# Patient Record
Sex: Female | Born: 1970 | Race: White | Hispanic: No | Marital: Married | Smoking: Never smoker
Health system: Southern US, Community
[De-identification: ages and names within clinical notes are randomized; demographics above are authoritative.]

## PROBLEM LIST (undated history)

## (undated) DIAGNOSIS — Z8619 Personal history of other infectious and parasitic diseases: Secondary | ICD-10-CM

## (undated) DIAGNOSIS — R569 Unspecified convulsions: Secondary | ICD-10-CM

## (undated) DIAGNOSIS — Z87440 Personal history of urinary (tract) infections: Secondary | ICD-10-CM

## (undated) DIAGNOSIS — N6002 Solitary cyst of left breast: Secondary | ICD-10-CM

## (undated) DIAGNOSIS — N6001 Solitary cyst of right breast: Secondary | ICD-10-CM

## (undated) HISTORY — DX: Solitary cyst of right breast: N60.02

## (undated) HISTORY — DX: Personal history of other infectious and parasitic diseases: Z86.19

## (undated) HISTORY — PX: TUBAL LIGATION: SHX77

## (undated) HISTORY — DX: Solitary cyst of right breast: N60.01

## (undated) HISTORY — DX: Unspecified convulsions: R56.9

## (undated) HISTORY — DX: Personal history of urinary (tract) infections: Z87.440

---

## 1996-10-17 HISTORY — PX: OVARY SURGERY: SHX727

## 2001-07-31 ENCOUNTER — Inpatient Hospital Stay (HOSPITAL_COMMUNITY): Admission: AD | Admit: 2001-07-31 | Discharge: 2001-08-02 | Payer: Self-pay | Admitting: Physical Therapy

## 2001-08-29 ENCOUNTER — Other Ambulatory Visit: Admission: RE | Admit: 2001-08-29 | Discharge: 2001-08-29 | Payer: Self-pay | Admitting: Obstetrics and Gynecology

## 2003-01-29 ENCOUNTER — Other Ambulatory Visit: Admission: RE | Admit: 2003-01-29 | Discharge: 2003-01-29 | Payer: Self-pay | Admitting: Obstetrics and Gynecology

## 2004-03-10 ENCOUNTER — Other Ambulatory Visit: Admission: RE | Admit: 2004-03-10 | Discharge: 2004-03-10 | Payer: Self-pay | Admitting: Obstetrics and Gynecology

## 2004-07-16 ENCOUNTER — Emergency Department (HOSPITAL_COMMUNITY): Admission: EM | Admit: 2004-07-16 | Discharge: 2004-07-16 | Payer: Self-pay | Admitting: Emergency Medicine

## 2004-09-20 ENCOUNTER — Encounter: Admission: RE | Admit: 2004-09-20 | Discharge: 2004-09-20 | Payer: Self-pay | Admitting: Neurology

## 2005-07-15 ENCOUNTER — Other Ambulatory Visit: Admission: RE | Admit: 2005-07-15 | Discharge: 2005-07-15 | Payer: Self-pay | Admitting: Obstetrics and Gynecology

## 2008-02-29 ENCOUNTER — Ambulatory Visit: Payer: Self-pay | Admitting: Family Medicine

## 2008-04-03 ENCOUNTER — Ambulatory Visit: Payer: Self-pay | Admitting: Family Medicine

## 2009-03-03 ENCOUNTER — Ambulatory Visit: Payer: Self-pay | Admitting: Diagnostic Radiology

## 2009-03-03 ENCOUNTER — Emergency Department (HOSPITAL_BASED_OUTPATIENT_CLINIC_OR_DEPARTMENT_OTHER): Admission: EM | Admit: 2009-03-03 | Discharge: 2009-03-03 | Payer: Self-pay | Admitting: Emergency Medicine

## 2009-12-08 ENCOUNTER — Ambulatory Visit (HOSPITAL_COMMUNITY): Admission: RE | Admit: 2009-12-08 | Discharge: 2009-12-08 | Payer: Self-pay | Admitting: Obstetrics and Gynecology

## 2010-01-19 ENCOUNTER — Ambulatory Visit (HOSPITAL_COMMUNITY): Admission: RE | Admit: 2010-01-19 | Discharge: 2010-01-19 | Payer: Self-pay | Admitting: Obstetrics and Gynecology

## 2010-02-02 ENCOUNTER — Ambulatory Visit (HOSPITAL_COMMUNITY): Admission: RE | Admit: 2010-02-02 | Discharge: 2010-02-02 | Payer: Self-pay | Admitting: Obstetrics and Gynecology

## 2010-02-19 ENCOUNTER — Ambulatory Visit (HOSPITAL_COMMUNITY): Admission: RE | Admit: 2010-02-19 | Discharge: 2010-02-19 | Payer: Self-pay | Admitting: Obstetrics and Gynecology

## 2010-03-05 ENCOUNTER — Ambulatory Visit (HOSPITAL_COMMUNITY): Admission: RE | Admit: 2010-03-05 | Discharge: 2010-03-05 | Payer: Self-pay | Admitting: Obstetrics and Gynecology

## 2010-03-19 ENCOUNTER — Ambulatory Visit (HOSPITAL_COMMUNITY): Admission: RE | Admit: 2010-03-19 | Discharge: 2010-03-19 | Payer: Self-pay | Admitting: Obstetrics and Gynecology

## 2010-04-02 ENCOUNTER — Ambulatory Visit (HOSPITAL_COMMUNITY): Admission: RE | Admit: 2010-04-02 | Discharge: 2010-04-02 | Payer: Self-pay | Admitting: Obstetrics and Gynecology

## 2010-04-21 ENCOUNTER — Ambulatory Visit (HOSPITAL_COMMUNITY): Admission: RE | Admit: 2010-04-21 | Discharge: 2010-04-21 | Payer: Self-pay | Admitting: Obstetrics and Gynecology

## 2010-04-27 ENCOUNTER — Ambulatory Visit (HOSPITAL_COMMUNITY): Admission: RE | Admit: 2010-04-27 | Discharge: 2010-04-27 | Payer: Self-pay | Admitting: Obstetrics and Gynecology

## 2010-05-06 ENCOUNTER — Ambulatory Visit (HOSPITAL_COMMUNITY): Admission: RE | Admit: 2010-05-06 | Discharge: 2010-05-06 | Payer: Self-pay | Admitting: Obstetrics and Gynecology

## 2010-05-10 ENCOUNTER — Ambulatory Visit (HOSPITAL_COMMUNITY): Admission: RE | Admit: 2010-05-10 | Discharge: 2010-05-10 | Payer: Self-pay | Admitting: Obstetrics and Gynecology

## 2010-05-18 ENCOUNTER — Ambulatory Visit (HOSPITAL_COMMUNITY): Admission: RE | Admit: 2010-05-18 | Discharge: 2010-05-18 | Payer: Self-pay | Admitting: Obstetrics and Gynecology

## 2010-05-26 ENCOUNTER — Ambulatory Visit (HOSPITAL_COMMUNITY): Admission: RE | Admit: 2010-05-26 | Discharge: 2010-05-26 | Payer: Self-pay | Admitting: Obstetrics and Gynecology

## 2010-06-02 ENCOUNTER — Encounter (INDEPENDENT_AMBULATORY_CARE_PROVIDER_SITE_OTHER): Payer: Self-pay | Admitting: Obstetrics and Gynecology

## 2010-06-02 ENCOUNTER — Inpatient Hospital Stay (HOSPITAL_COMMUNITY): Admission: RE | Admit: 2010-06-02 | Discharge: 2010-06-04 | Payer: Self-pay | Admitting: Obstetrics and Gynecology

## 2010-12-30 LAB — CROSSMATCH
DAT, IgG: NEGATIVE
Donor AG Type: NEGATIVE
Donor AG Type: NEGATIVE
PT AG Type: NEGATIVE

## 2010-12-30 LAB — CBC
HCT: 27.2 % — ABNORMAL LOW (ref 36.0–46.0)
Hemoglobin: 9.5 g/dL — ABNORMAL LOW (ref 12.0–15.0)
MCH: 32.9 pg (ref 26.0–34.0)
MCHC: 34.8 g/dL (ref 30.0–36.0)
RBC: 2.89 MIL/uL — ABNORMAL LOW (ref 3.87–5.11)
RDW: 14.5 % (ref 11.5–15.5)

## 2010-12-31 LAB — CBC
MCH: 32.2 pg (ref 26.0–34.0)
RDW: 14.5 % (ref 11.5–15.5)

## 2010-12-31 LAB — SURGICAL PCR SCREEN: Staphylococcus aureus: NEGATIVE

## 2011-03-04 NOTE — Op Note (Signed)
[facility]  Patient:    Danielle Mays, Danielle Mays Visit Number: [id number] MRN: [id number]          Service Type: OBS Location: 910A [id number] [id number] Attending Physician:  Cordelia Pen Ii Dictated by:   Duke Salvia. Marcelle Overlie, M.D. Proc. Date: 07/31/01 Admit Date:  07/31/2001                             Operative Report  OBSTETRICIAN: Duke Salvia. Marcelle Overlie, M.D.  DELIVERY NOTE: The patient had been pushing for a little over 2-1/2 hours and achieved crowning, and preparations were made for vaginal delivery.  The fetal heart rate was stable through second stage per the nursing assessment.  The vertex delivered spontaneously.  There was a tight nuchal cord that was reduced.  Bulb suction was carried out at that point.  The torso delivered easily.  The infant failed to make initial good breathing effort.  The PT team was called immediately, and carried out stimulation plus Ambu assisted respiration with good heart rate until they arrived.  A pH was 7.20 arterial. Apgars 2, 4, 7.  Placenta delivered spontaneously intact.  Second degree perineal laceration was repaired in standard fashion.  No other lacerations noted.  Immediately prepartum she had a 102 degree temperature and ampicillin 2 g IV was started.  This will not be continued postpartum if she remains afebrile.  Mother and baby doing well at that point.  The baby went to the regular nursery.  Placenta was sent to pathology.  ESTIMATED BLOOD LOSS: EBL, 350 cc. Dictated by:   Duke Salvia. Marcelle Overlie, M.D. Attending Physician:  Soledad Gerlach DD:  07/31/01 TD:  08/01/01 Job: [id number]

## 2011-06-17 ENCOUNTER — Ambulatory Visit (INDEPENDENT_AMBULATORY_CARE_PROVIDER_SITE_OTHER): Payer: BC Managed Care – PPO | Admitting: Internal Medicine

## 2011-06-17 ENCOUNTER — Encounter: Payer: Self-pay | Admitting: Internal Medicine

## 2011-06-17 DIAGNOSIS — R05 Cough: Secondary | ICD-10-CM

## 2011-06-17 MED ORDER — LEVOFLOXACIN 500 MG PO TABS: 500.0000 mg | ORAL_TABLET | Freq: Every day | ORAL | Status: AC

## 2011-06-26 ENCOUNTER — Encounter: Payer: Self-pay | Admitting: Internal Medicine

## 2011-06-26 DIAGNOSIS — R059 Cough, unspecified: Secondary | ICD-10-CM | POA: Insufficient documentation

## 2011-06-26 DIAGNOSIS — R05 Cough: Secondary | ICD-10-CM | POA: Insufficient documentation

## 2011-06-26 NOTE — Progress Notes (Signed)
  Subjective:    Patient ID: Danielle Mays, female    DOB: 11/23/1970, 40 y.o.   MRN: [id number]  HPI Pt presents to clinic for evaluation of cough. Two weeks ago while on vacation developed cough and URI sx's. Seen at [facility] and dx'ed with pneumonia based on CXR reading by clinic physician. Took zpak without improvement. Seen at another UC with nl CXR. Given cough syrup with codeine. Now with cough productive for yellow sputum without hemoptysis, wheezing or dyspnea. Cough worse at night. No other alleviating or exacerbating factors. No other complaints.  Reviewed pmh, psh, medications, allergies, soc hx and fam hx.    Review of Systems  Constitutional: Negative for fever and chills.  Respiratory: Positive for cough. Negative for shortness of breath and wheezing.   All other systems reviewed and are negative.       Objective:   Physical Exam  Nursing note and vitals reviewed. Constitutional: She appears well-developed and well-nourished. No distress.  HENT:  Head: Normocephalic and atraumatic.  Right Ear: Tympanic membrane, external ear and ear canal normal.  Left Ear: Tympanic membrane, external ear and ear canal normal.  Nose: Nose normal.  Mouth/Throat: Oropharynx is clear and moist. No oropharyngeal exudate.  Eyes: Conjunctivae are normal. No scleral icterus.  Neck: Neck supple.  Cardiovascular: Normal rate, regular rhythm and normal heart sounds.  Exam reveals no gallop and no friction rub.   No murmur heard. Pulmonary/Chest: Effort normal and breath sounds normal. No respiratory distress. She has no wheezes. She has no rales.  Lymphadenopathy:    She has no cervical adenopathy.  Neurological: She is alert.  Skin: Skin is warm and dry. No rash noted. She is not diaphoretic.  Psychiatric: She has a normal mood and affect.          Assessment & Plan:

## 2011-06-26 NOTE — Assessment & Plan Note (Signed)
Hx suggestive of bronchitis. Recent cxr reportedly without infiltrate. Failed zpak. Begin levaquin 500mg  po qd x 10d. Followup if no improvement or worsening.

## 2011-07-06 ENCOUNTER — Telehealth: Payer: Self-pay | Admitting: Internal Medicine

## 2011-07-06 NOTE — Telephone Encounter (Signed)
Received medical records from [facility].

## 2011-07-14 ENCOUNTER — Encounter: Payer: BC Managed Care – PPO | Admitting: Internal Medicine

## 2011-07-19 ENCOUNTER — Encounter: Payer: BC Managed Care – PPO | Admitting: Internal Medicine

## 2011-07-21 ENCOUNTER — Other Ambulatory Visit: Payer: Self-pay | Admitting: Internal Medicine

## 2011-07-21 DIAGNOSIS — Z0000 Encounter for general adult medical examination without abnormal findings: Secondary | ICD-10-CM

## 2011-07-22 LAB — BASIC METABOLIC PANEL
CO2: 24 mEq/L (ref 19–32)
Chloride: 107 mEq/L (ref 96–112)
Creat: 0.79 mg/dL (ref 0.50–1.10)

## 2011-07-22 LAB — HEPATIC FUNCTION PANEL
ALT: 10 U/L (ref 0–35)
AST: 13 U/L (ref 0–37)
Albumin: 4.6 g/dL (ref 3.5–5.2)
Alkaline Phosphatase: 62 U/L (ref 39–117)
Bilirubin, Direct: 0.2 mg/dL (ref 0.0–0.3)
Indirect Bilirubin: 0.5 mg/dL (ref 0.0–0.9)
Total Bilirubin: 0.7 mg/dL (ref 0.3–1.2)
Total Protein: 6.8 g/dL (ref 6.0–8.3)

## 2011-07-22 LAB — URINALYSIS, ROUTINE W REFLEX MICROSCOPIC
Bilirubin Urine: NEGATIVE
Leukocytes, UA: NEGATIVE
Nitrite: NEGATIVE
Urobilinogen, UA: 0.2 mg/dL (ref 0.0–1.0)

## 2011-07-22 LAB — CBC
MCV: 89.2 fL (ref 78.0–100.0)
Platelets: 264 10*3/uL (ref 150–400)
RDW: 13.4 % (ref 11.5–15.5)

## 2011-07-22 LAB — HEMOGLOBIN A1C: Mean Plasma Glucose: 97 mg/dL (ref <117)

## 2011-07-22 LAB — LIPID PANEL: HDL: 42 mg/dL (ref >39)

## 2011-07-28 ENCOUNTER — Ambulatory Visit (INDEPENDENT_AMBULATORY_CARE_PROVIDER_SITE_OTHER): Payer: BC Managed Care – PPO | Admitting: Internal Medicine

## 2011-07-28 ENCOUNTER — Encounter: Payer: Self-pay | Admitting: Internal Medicine

## 2011-07-28 DIAGNOSIS — Z0000 Encounter for general adult medical examination without abnormal findings: Secondary | ICD-10-CM | POA: Insufficient documentation

## 2011-07-28 DIAGNOSIS — Z23 Encounter for immunization: Secondary | ICD-10-CM

## 2011-07-28 MED ORDER — LEVOFLOXACIN 500 MG PO TABS: 500.0000 mg | ORAL_TABLET | Freq: Every day | ORAL | Status: AC

## 2011-07-28 NOTE — Assessment & Plan Note (Signed)
Nl exam and labs reviewed. Given persistence of cough recommend additional course of levaquin x 7days. Followup if no improvement or worsening.

## 2011-07-28 NOTE — Patient Instructions (Signed)
Please schedule cbc, chem7, lipid, a1c, tsh, lft, urinalysis (v70.0) prior to next year's physical

## 2011-07-28 NOTE — Progress Notes (Signed)
  Subjective:    Patient ID: Danielle Mays, female    DOB: 1971/01/16, 40 y.o.   MRN: [id number]  HPI Pt presents to clinic for annual exam. Pap smears utd and reportedly nl followed by gyn. Reviewed nl cpe labs with patient. Needs tdap update. Now s/p zpak and levaquin course for likely bronchitis. Significantly improved. All sx's resolved with exception of mild cough productive for green sputum without hemoptysis. Had reportedly nl cxr at outside clinic. No other complaints.  Past Medical History  Diagnosis Date  . History of chicken pox     age 49  . History of recurrent UTIs     age 44-25 > 100   Past Surgical History  Procedure Date  . Ovary surgery 1998    Right ovary removed  . Cesarean section 07/03/2010    reports that she has never smoked. She has never used smokeless tobacco. She reports that she drinks alcohol. She reports that she does not use illicit drugs. family history includes Breast cancer in her maternal aunt; Diabetes type II in her mother; Drug abuse in her father; Emphysema in her paternal grandfather and paternal grandmother; Hypertension in her maternal grandfather, maternal grandmother, and mother; Ovarian cancer in her paternal grandmother; and Stroke in her maternal grandfather. No Known Allergies     Review of Systems see hpi     Objective:   Physical Exam  Physical Exam  Nursing note and vitals reviewed. Constitutional:  appears well-developed and well-nourished. No distress.  HENT:  Head: Normocephalic and atraumatic.  Right Ear: Tympanic membrane and external ear normal.  Left Ear: Tympanic membrane and external ear normal.  Nose: Nose normal.  Mouth/Throat: Uvula is midline, oropharynx is clear and moist and mucous membranes are normal. No oropharyngeal exudate.  Eyes: Conjunctivae and EOM are normal. Pupils are equal, round, and reactive to light. Right eye exhibits no discharge. Left eye exhibits no discharge. No scleral icterus.  Neck:  Neck supple. Carotid bruit is not present. No thyromegaly present.  Cardiovascular: Normal rate, regular rhythm and normal heart sounds.  Exam reveals no gallop and no friction rub.   No murmur heard. Pulmonary/Chest: Effort normal and breath sounds normal. No respiratory distress. He has no wheezes.  no rales.  Abdominal: Soft.  no distension and no mass. There is no hepatosplenomegaly. There is no tenderness. There is no rebound.Neurological: He is alert.  Skin: Skin is warm and dry.  not diaphoretic.  Psychiatric: has a normal mood and affect.         Assessment & Plan:

## 2011-12-08 IMAGING — US US OB MCA DOPPLER
1 series · 14 of 19 positions shown · non-contrast
Comparison: none

OBSTETRICAL ULTRASOUND:
 This ultrasound was performed in The [HOSPITAL], and the [facility] report will be stored to [REDACTED] PACS.  This report is also available in [HOSPITAL]?s accessANYware.

[Series 1: us ob mca doppler · 14 of 19 slices shown]
[im 1/19]
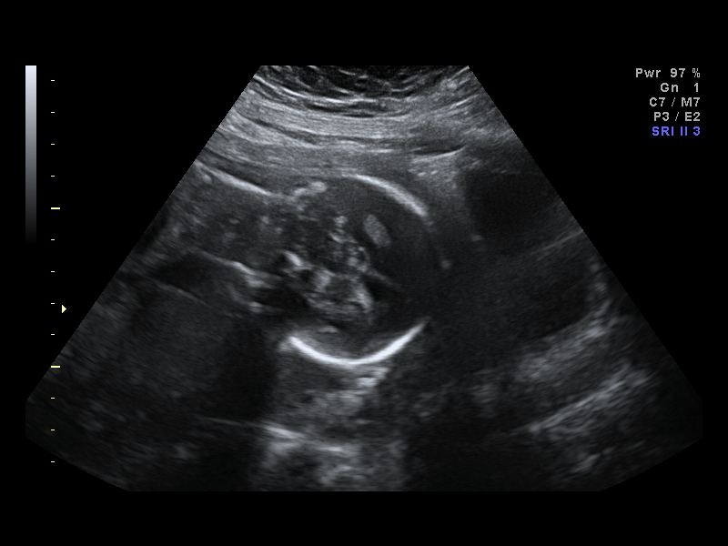
[im 3/19]
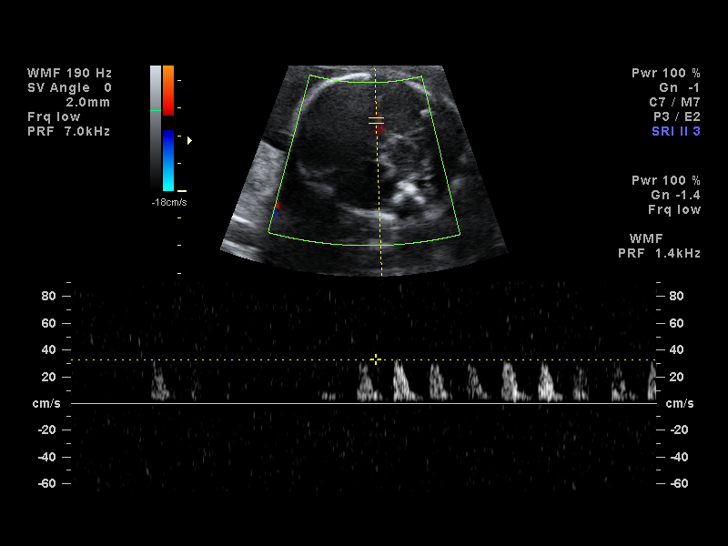
[im 4/19]
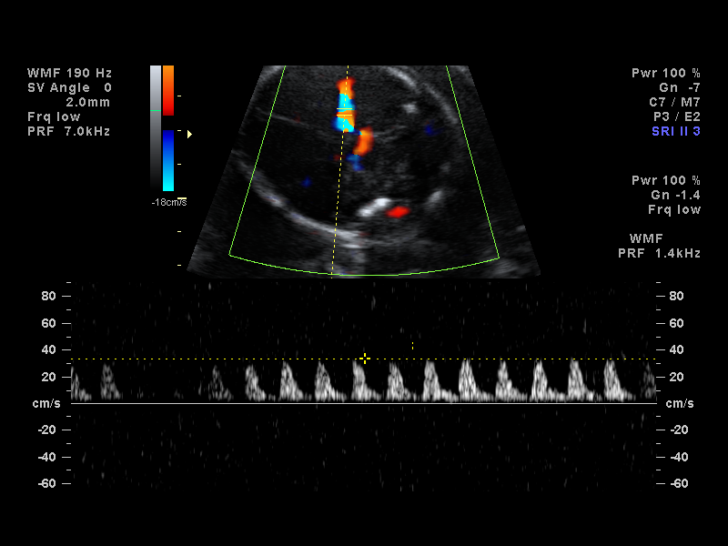
[im 5/19]
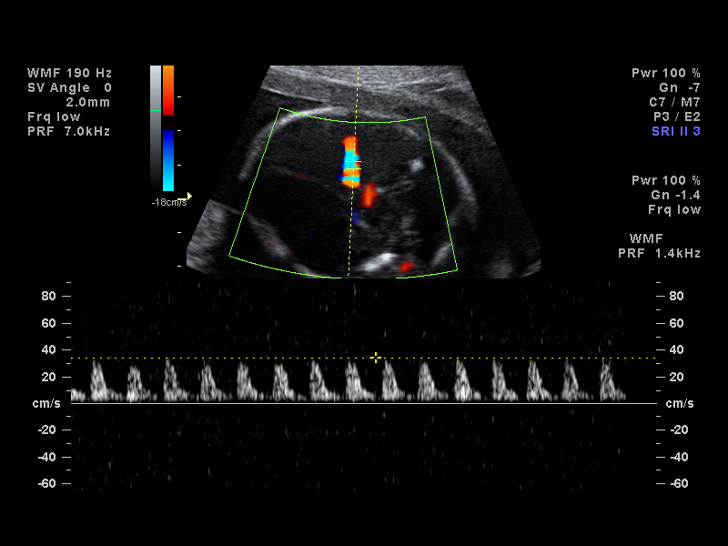
[im 7/19]
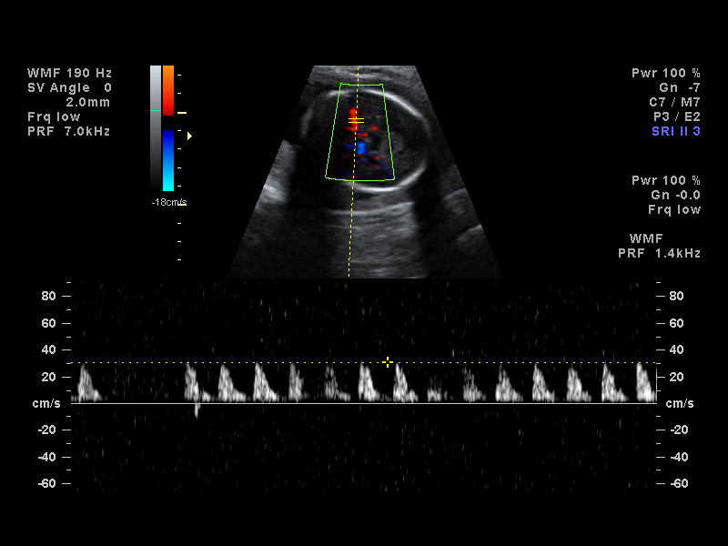
[im 8/19]
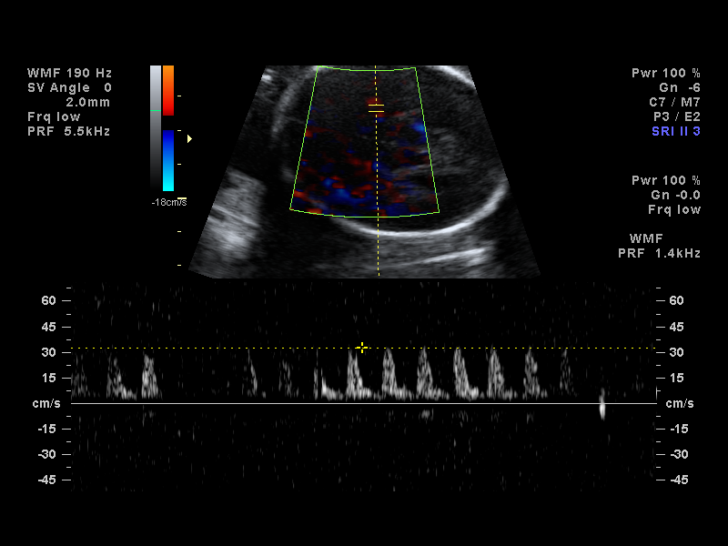
[im 9/19]
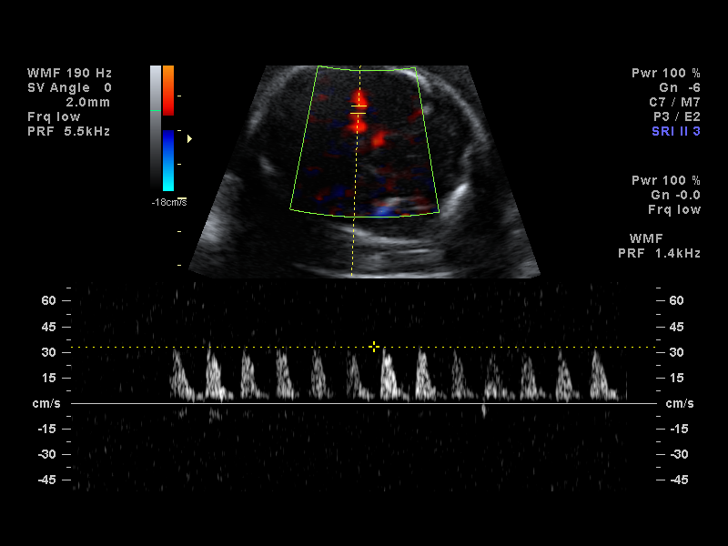
[im 11/19]
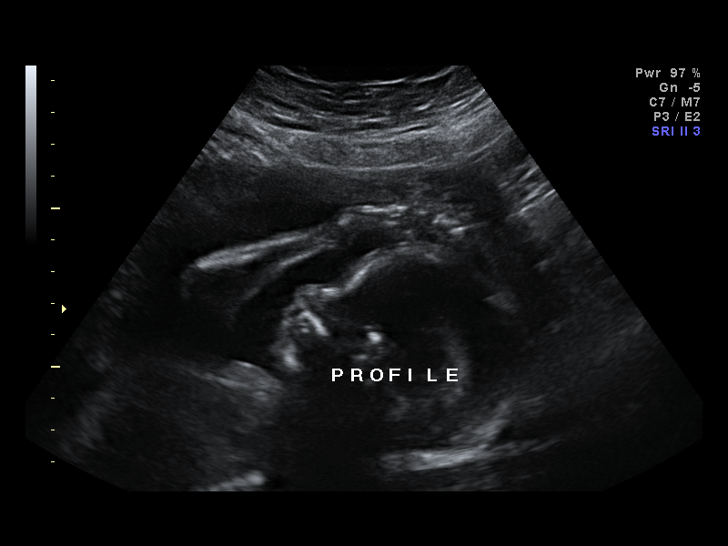
[im 12/19]
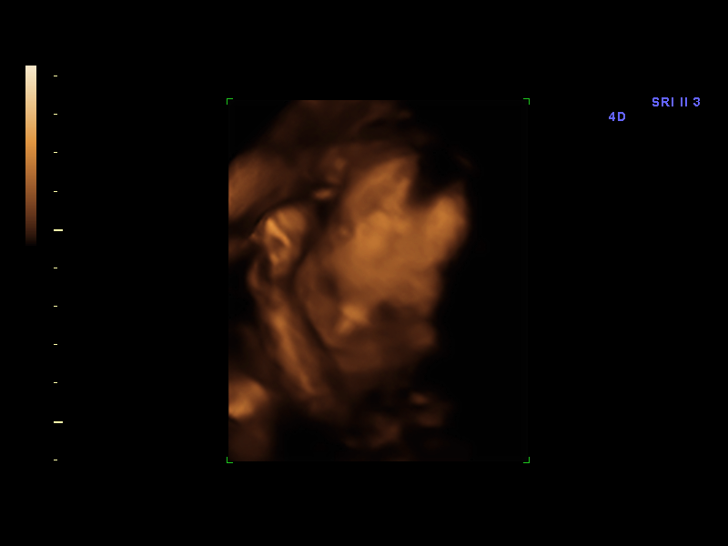
[im 13/19]
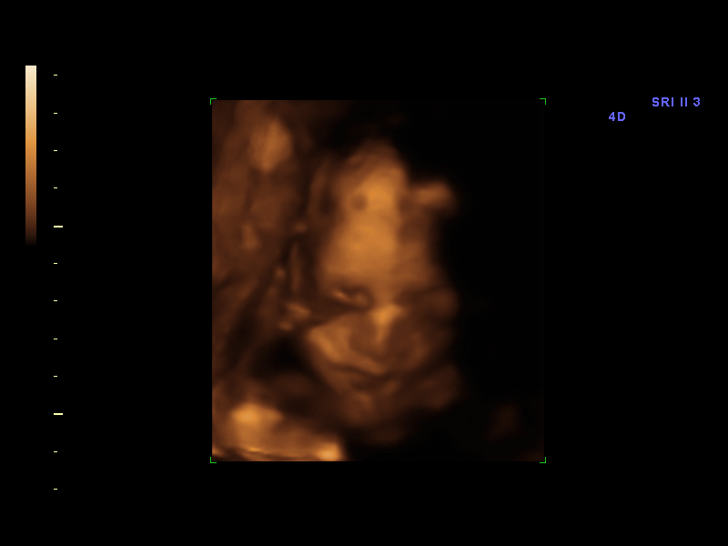
[im 15/19]
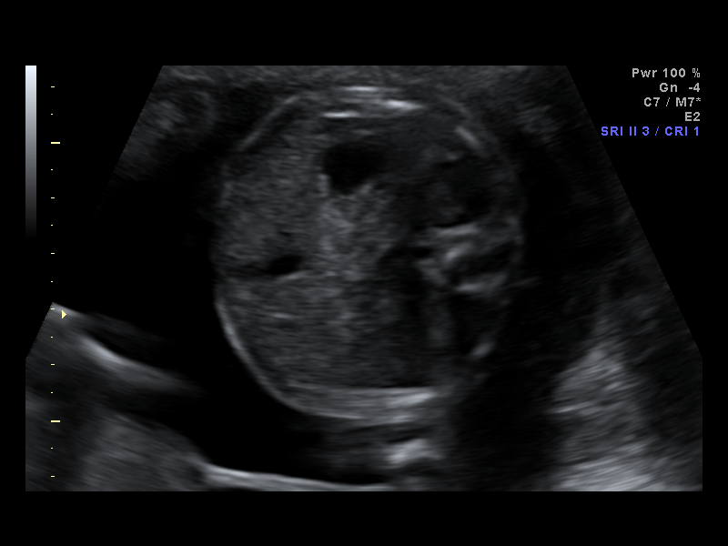
[im 16/19]
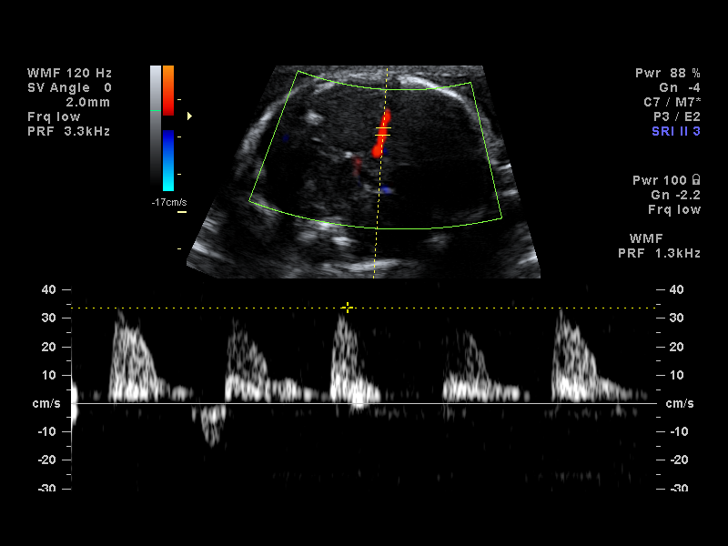
[im 17/19]
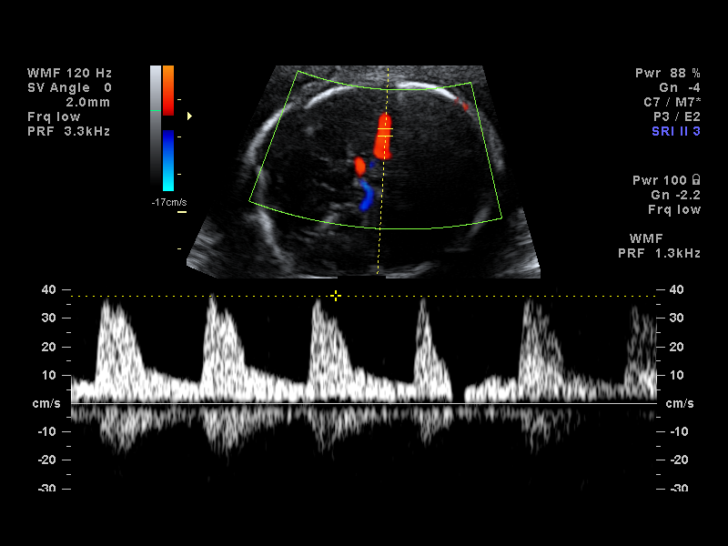
[im 19/19]
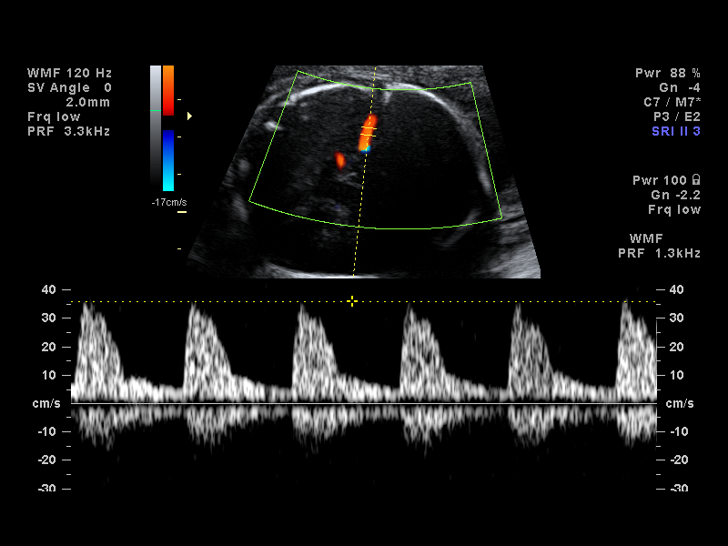

[14 of 19 positions shown; findings below may reference images not displayed]

IMPRESSION: [facility] has also been faxed to the ordering physician.

## 2013-10-22 ENCOUNTER — Ambulatory Visit (INDEPENDENT_AMBULATORY_CARE_PROVIDER_SITE_OTHER): Payer: BC Managed Care – PPO | Admitting: Family

## 2013-10-22 ENCOUNTER — Encounter: Payer: Self-pay | Admitting: Family

## 2013-10-22 VITALS — BP 118/82 | HR 89 | Temp 98.0°F | Resp 16 | Ht 62.0 in | Wt 206.0 lb

## 2013-10-22 DIAGNOSIS — G44209 Tension-type headache, unspecified, not intractable: Secondary | ICD-10-CM | POA: Insufficient documentation

## 2013-10-22 DIAGNOSIS — R635 Abnormal weight gain: Secondary | ICD-10-CM

## 2013-10-22 DIAGNOSIS — R5383 Other fatigue: Secondary | ICD-10-CM

## 2013-10-22 DIAGNOSIS — E039 Hypothyroidism, unspecified: Secondary | ICD-10-CM

## 2013-10-22 DIAGNOSIS — M79601 Pain in right arm: Secondary | ICD-10-CM

## 2013-10-22 DIAGNOSIS — M79609 Pain in unspecified limb: Secondary | ICD-10-CM

## 2013-10-22 DIAGNOSIS — R079 Chest pain, unspecified: Secondary | ICD-10-CM

## 2013-10-22 DIAGNOSIS — R0789 Other chest pain: Secondary | ICD-10-CM

## 2013-10-22 DIAGNOSIS — M79603 Pain in arm, unspecified: Secondary | ICD-10-CM | POA: Insufficient documentation

## 2013-10-22 DIAGNOSIS — R5381 Other malaise: Secondary | ICD-10-CM

## 2013-10-22 LAB — CBC WITH DIFFERENTIAL/PLATELET
Basophils Absolute: 0 10*3/uL (ref 0.0–0.1)
Basophils Relative: 1 % (ref 0–1)
EOS ABS: 0.2 10*3/uL (ref 0.0–0.7)
EOS PCT: 3 % (ref 0–5)
HCT: 38.9 % (ref 36.0–46.0)
HEMOGLOBIN: 13.3 g/dL (ref 12.0–15.0)
LYMPHS ABS: 3.2 10*3/uL (ref 0.7–4.0)
Lymphocytes Relative: 41 % (ref 12–46)
MCH: 29.5 pg (ref 26.0–34.0)
MCHC: 34.2 g/dL (ref 30.0–36.0)
MCV: 86.3 fL (ref 78.0–100.0)
MONOS PCT: 6 % (ref 3–12)
Monocytes Absolute: 0.5 10*3/uL (ref 0.1–1.0)
NEUTROS ABS: 3.9 10*3/uL (ref 1.7–7.7)
Neutrophils Relative %: 49 % (ref 43–77)
Platelets: 271 10*3/uL (ref 150–400)
RBC: 4.51 MIL/uL (ref 3.87–5.11)
RDW: 13.2 % (ref 11.5–15.5)
WBC: 7.8 10*3/uL (ref 4.0–10.5)

## 2013-10-22 NOTE — Assessment & Plan Note (Signed)
Symptoms most consistent with mild tension HA, likely exacerbated by neck tightness/pain. She works at a [employer] and I have advised her to work on Psychiatristproper body mechanics. She wishes to avoid additional medications for HA at this time.

## 2013-10-22 NOTE — Assessment & Plan Note (Signed)
Consistent with musculoskeletal pain. Pt has a 933 year old child who she carries sometimes and I think this is potential exacerbating factor.  Offered, NSAID, she declines.

## 2013-10-22 NOTE — Assessment & Plan Note (Signed)
Given heat intolerance, weight gain, concern for thyroid dysfunction. Will check TSH, and alsoobtain CBC to evaluate for anemia given fatigue.

## 2013-10-22 NOTE — Progress Notes (Signed)
Subjective:    Patient ID: Danielle Mays, female    DOB: 01/22/1971, 43 y.o.   MRN: [id number]  HPI  Danielle Mays is a 43 yr old female who presents today with several concerns. She was last seen at our office 07/28/11 for annual visit.    1) Headache- intermittent HA since July, constant since November. Reports feeling fatigued x 6 months, sweats at night- + dizziness which was severe on Sunday and lasted into Monday.  Weight has been rising.  Had initially lost some weight on Adipex.  Has an IUD- does not get periods.  She reports today she has pressure behind the left eye.  HA is dull and mild.  Usually has pressure on left side of head and has some strained pain at the base of the neck.  She reports that she has had some associated nausea this week. Denies photophobia.  Reports that the right eye seems to be slightly blurry but just had a normal eye exam and got new contacts.   2) Chest pain/R arm pain-  Reports intermittent episodes of chest pain in July and august. Reports that [place] Reports that symptoms seemed like heartburn. Took tums, woke up still had chest discomfort. This lasted intermittently for several weeks.  Had associated right arm pain with chest pain.  Chest pain is resolved by R arm pain persists. Arm pain is described as a dull soreness.    Review of Systems See HPI  Past Medical History  Diagnosis Date  . History of chicken pox     age 43  . History of recurrent UTIs     age 43-25 > 100    History   Social History  . Marital Status: Married    Spouse Name: N/A    Number of Children: N/A  . Years of Education: N/A   Occupational History  . Not on file.   Social History Main Topics  . Smoking status: Never Smoker   . Smokeless tobacco: Never Used  . Alcohol Use: Yes  . Drug Use: No  . Sexual Activity: Not on file   Other Topics Concern  . Not on file   Social History Narrative  . No narrative on  file    Past Surgical History  Procedure Laterality Date  . Ovary surgery  1998    Right ovary removed  . Cesarean section  07/03/2010    Family History  Problem Relation Age of Onset  . Drug abuse Father     died cocaine overdose  . Ovarian cancer Paternal Grandmother   . Breast cancer Maternal Aunt   . Emphysema Paternal Grandfather   . Emphysema Paternal Grandmother   . Stroke Maternal Grandfather   . Hypertension Mother   . Hypertension Maternal Grandfather   . Hypertension Maternal Grandmother   . Diabetes type II Mother     No Known Allergies  Current Outpatient Prescriptions on File Prior to Visit  Medication Sig Dispense Refill  . levonorgestrel (MIRENA) 20 MCG/24HR IUD 1 each by Intrauterine route once.        . Multiple Vitamin (MULTIVITAMIN) tablet Take 1 tablet by mouth daily.         No current facility-administered medications on file prior to visit.    BP 118/82  Pulse 89  Temp(Src) 98 F (36.7 C) (Oral)  Resp 16  Ht 5\' 2"  (1.575 m)  Wt 206 lb (93.441 kg)  BMI 37.67 kg/m2  SpO2 98%       Objective:   Physical Exam  Constitutional: She is oriented to person, place, and time. She appears well-developed and well-nourished. No distress.  HENT:  Head: Normocephalic and atraumatic.  Neck: Neck supple. No spinous process tenderness present. No edema and no erythema present. No thyromegaly present.  Cardiovascular: Normal rate and regular rhythm.   No murmur heard. Pulmonary/Chest: Effort normal and breath sounds normal. No respiratory distress. She has no wheezes. She has no rales. She exhibits no tenderness.  Musculoskeletal:       Arms: Mild right arm tenderness to palpation  Lymphadenopathy:    She has no cervical adenopathy.  Neurological: She is alert and oriented to person, place, and time.  Psychiatric: She has a normal mood and affect. Her behavior is normal. Judgment and thought content normal.          Assessment & Plan:

## 2013-10-22 NOTE — Assessment & Plan Note (Signed)
Resolved, was likely GI in origin.  EKG reviewed today- normal.

## 2013-10-22 NOTE — Progress Notes (Signed)
Pre visit review using our clinic review tool, if applicable. No additional management support is needed unless otherwise documented below in the visit note. 

## 2013-10-22 NOTE — Patient Instructions (Signed)
Please complete your lab work prior to leaving. Follow up in 1 month, sooner if problems/concerns.

## 2013-10-23 ENCOUNTER — Encounter: Payer: Self-pay | Admitting: Family

## 2013-10-23 LAB — BASIC METABOLIC PANEL WITH GFR
BUN: 10 mg/dL (ref 6–23)
CALCIUM: 9.3 mg/dL (ref 8.4–10.5)
CHLORIDE: 105 meq/L (ref 96–112)
CO2: 26 meq/L (ref 19–32)
Creat: 0.61 mg/dL (ref 0.50–1.10)
GFR, Est Non African American: >89 mL/min
GLUCOSE: 81 mg/dL (ref 70–99)
Potassium: 4.7 mEq/L (ref 3.5–5.3)
Sodium: 139 mEq/L (ref 135–145)

## 2013-10-23 LAB — TSH: TSH: 2.264 u[IU]/mL (ref 0.350–4.500)

## 2013-10-28 ENCOUNTER — Telehealth: Payer: Self-pay | Admitting: *Deleted

## 2013-10-28 NOTE — Telephone Encounter (Signed)
Pt left message requesting recent lab results.  Notified pt of normal results per lab letter.

## 2013-11-15 ENCOUNTER — Other Ambulatory Visit (INDEPENDENT_AMBULATORY_CARE_PROVIDER_SITE_OTHER): Payer: Self-pay | Admitting: Otolaryngology

## 2013-11-15 DIAGNOSIS — R29898 Other symptoms and signs involving the musculoskeletal system: Secondary | ICD-10-CM

## 2013-11-15 DIAGNOSIS — R200 Anesthesia of skin: Secondary | ICD-10-CM

## 2013-11-25 ENCOUNTER — Ambulatory Visit
Admission: RE | Admit: 2013-11-25 | Discharge: 2013-11-25 | Disposition: A | Discharge status: Home / Self Care | Payer: BC Managed Care – PPO | Source: Ambulatory Visit | Attending: Otolaryngology | Admitting: Otolaryngology

## 2013-11-25 DIAGNOSIS — R200 Anesthesia of skin: Secondary | ICD-10-CM

## 2013-11-25 DIAGNOSIS — R29898 Other symptoms and signs involving the musculoskeletal system: Secondary | ICD-10-CM

## 2013-11-25 MED ORDER — GADOBENATE DIMEGLUMINE 529 MG/ML IV SOLN
19.0000 mL | Freq: Once | INTRAVENOUS | Status: AC | PRN
  Administered 2013-11-25: 19 mL via INTRAVENOUS

## 2013-11-28 ENCOUNTER — Ambulatory Visit: Payer: BC Managed Care – PPO | Admitting: Family Medicine

## 2013-11-28 ENCOUNTER — Encounter: Payer: Self-pay | Admitting: Neurology

## 2013-11-28 ENCOUNTER — Ambulatory Visit (INDEPENDENT_AMBULATORY_CARE_PROVIDER_SITE_OTHER): Payer: BC Managed Care – PPO | Admitting: Neurology

## 2013-11-28 ENCOUNTER — Encounter (INDEPENDENT_AMBULATORY_CARE_PROVIDER_SITE_OTHER): Payer: Self-pay

## 2013-11-28 ENCOUNTER — Ambulatory Visit (INDEPENDENT_AMBULATORY_CARE_PROVIDER_SITE_OTHER): Payer: BC Managed Care – PPO

## 2013-11-28 VITALS — BP 132/73 | HR 92 | Ht 62.0 in | Wt 207.0 lb

## 2013-11-28 DIAGNOSIS — R202 Paresthesia of skin: Secondary | ICD-10-CM | POA: Insufficient documentation

## 2013-11-28 DIAGNOSIS — H5310 Unspecified subjective visual disturbances: Secondary | ICD-10-CM

## 2013-11-28 DIAGNOSIS — R209 Unspecified disturbances of skin sensation: Secondary | ICD-10-CM

## 2013-11-28 NOTE — Progress Notes (Signed)
PATIENT: Danielle Mays DOB: 07/02/1971  HISTORICAL  Danielle Mays is a 43 yo RH Caucasian female, referred by ENT Dr. Suszanne Connerseoh for constellation of complaints   In July 2014, she woke up notice right arm tingling, intermittent at the beginning, later she noticed more tingling while holding pen, writing, by August 2014, she noticed both arm, hands numbness tingling.  In September October 2014, she also experienced extreme fatigue, lack of energy, in November around Thanksgiving time, she began to notice difficulty remembering, focusing, she become hearing during today's interview  Now she has constant bilateral hands, right leg numbness tingling, recent few weeks, she also noticed the left eye strained sensation when moving around,   Mild color washout, mild blurry vision.  She denies significant gait difficulty, no bowel bladder incontinence,  She has looked up internet, worry about the possibility of  multiple sclerosis   She [facility], with without contrast, we have looked at the films together, only few subcortical   white matter disease,  most consistent with small vessel disease  Recent laboratory showed normal CBC, TSH, BMP  REVIEW OF SYSTEMS: Full 14 system review of systems performed and notable only for weight gain, fatigue, blurry vision, eye pain, memory loss, headaches, numbness, weakness, dizziness   ALLERGIES: No Known Allergies  HOME MEDICATIONS: Current Outpatient Prescriptions on File Prior to Visit  Medication Sig Dispense Refill  . levonorgestrel (MIRENA) 20 MCG/24HR IUD 1 each by Intrauterine route once.        . Multiple Vitamin (MULTIVITAMIN) tablet Take 1 tablet by mouth daily.           PAST MEDICAL HISTORY: Past Medical History  Diagnosis Date  . History of chicken pox at age 43.   Marland Kitchen. History of recurrent UTIs     PAST SURGICAL HISTORY: Past Surgical History  Procedure Laterality Date  . Ovary  surgery  1998    Right ovary removed  . Cesarean section  07/03/2010    FAMILY HISTORY: Family History  Problem Relation Age of Onset  . Drug abuse Father     died cocaine overdose  . Ovarian cancer Paternal Grandmother   . Emphysema Paternal Grandmother   . Breast cancer Maternal Aunt   . Emphysema Paternal Grandfather   . Stroke Maternal Grandfather   . Hypertension Maternal Grandfather   . Hypertension Mother   . Diabetes type II Mother   . Hypertension Maternal Grandmother     SOCIAL HISTORY:  History   Social History  . Marital Status: Married    Spouse Name: N/A    Number of Children: 2  . Years of Education: N/A   Occupational History  . Software engineerHuman resource Director   Social History Main Topics  . Smoking status: Never Smoker   . Smokeless tobacco: Never Used  . Alcohol Use: Yes  . Drug Use: No  . Sexual Activity: Not on file   Other Topics Concern  . Not on file   Social History Narrative  . No narrative on file    PHYSICAL EXAM   Filed Vitals:   11/28/13 1402  BP: 132/73  Pulse: 92  Height: 5\' 2"  (1.575 m)  Weight: 207 lb (93.895 kg)     Body mass index is 37.85 kg/(m^2).   Generalized: In no acute distress  Neck: Supple, no carotid bruits   Cardiac: Regular rate rhythm  Pulmonary: Clear to auscultation bilaterally  Musculoskeletal: No deformity  Neurological examination  Mentation: Alert oriented to time, place, history taking, and causual conversation  Cranial nerve II-XII: Pupils were equal round reactive to light. Extraocular movements were full.  Visual field were full on confrontational test. Bilateral fundi were sharp.  Facial sensation and strength were normal. Hearing was intact to finger rubbing bilaterally. Uvula tongue midline.  Head turning and shoulder shrug and were normal and symmetric.Tongue protrusion into cheek strength was normal.  Motor: Normal tone, bulk and strength.  Sensory: Intact to fine touch, pinprick,  preserved vibratory sensation, and proprioception at toes.  Coordination: Normal finger to nose, heel-to-shin bilaterally there was no truncal ataxia  Gait: Rising up from seated position without assistance, normal stance, without trunk ataxia, moderate stride, good arm swing, smooth turning, able to perform tiptoe, and heel walking without difficulty.   Romberg signs: Negative  Deep tendon reflexes: Brachioradialis 2/2, biceps 2/2, triceps 2/2, patellar 2/2, Achilles 2/2, plantar responses were flexor bilaterally.   DIAGNOSTIC DATA (LABS, IMAGING, TESTING) - I reviewed patient records, labs, notes, testing and imaging myself where available.  Lab Results  Component Value Date   WBC 7.8 10/22/2013   HGB 13.3 10/22/2013   HCT 38.9 10/22/2013   MCV 86.3 10/22/2013   PLT 271 10/22/2013      Component Value Date/Time   NA 139 10/22/2013 1210   K 4.7 10/22/2013 1210   CL 105 10/22/2013 1210   CO2 26 10/22/2013 1210   GLUCOSE 81 10/22/2013 1210   BUN 10 10/22/2013 1210   CREATININE 0.61 10/22/2013 1210   CALCIUM 9.3 10/22/2013 1210   PROT 6.8 07/21/2011 1438   ALBUMIN 4.6 07/21/2011 1438   AST 13 07/21/2011 1438   ALT 10 07/21/2011 1438   ALKPHOS 62 07/21/2011 1438   BILITOT 0.7 07/21/2011 1438   Lab Results  Component Value Date   CHOL 146 07/21/2011   HDL 42 07/21/2011   LDLCALC 96 07/21/2011   TRIG 39 07/21/2011   CHOLHDL 3.5 07/21/2011   Lab Results  Component Value Date   HGBA1C 5.0 07/21/2011   No results found for this basename: [id number]   Lab Results  Component Value Date   TSH 2.264 10/22/2013    ASSESSMENT AND PLAN  Danielle Mays is a 43 y.o. female complains of  constellation of complaints detailed above, essentially normal neurological examination, normal MRI of the brain  1. not sure the exact etiology of her complaints, But differentiation diagnosis including left optic neuritis, cervical cord lesions, bilateral carpal tunnel syndromes   2 proceed with visual evoked potential,  MRI of cervical spine without contrast 3 laboratory evaluations 4.  EMG nerve conduction study     Levert Feinstein, M.D. Ph.D.  [facility] [address] [place], [place] [address] [phone]

## 2013-11-28 NOTE — Procedures (Signed)
    History:   Danielle Mays is a 43 year old patient with a history of tingling in the hands, blurring of vision with the left eye, pain in the extremities. MRI the brain has been unremarkable, but the patient is being evaluated for possible optic neuritis.  Description: The visual evoked response test was performed today using 32 x 32 check sizes. The absolute latencies for the N1 and the P100 wave forms were within normal limits bilaterally. The amplitudes for the P100 wave forms were also within normal limits bilaterally. The visual acuity was 20/30 OD and 20/30 OS corrected.  Impression:  The visual evoked response test above was within normal limits bilaterally. No evidence of conduction slowing was seen within the anterior visual pathways on either side on today's evaluation.

## 2013-12-02 NOTE — Progress Notes (Signed)
Quick Note:  Called and shared normal labs per Dr Zannie CoveYan's findings,patient vervalized understanding. ______

## 2013-12-05 LAB — IFE AND PE, SERUM
ALBUMIN SERPL ELPH-MCNC: 4.2 g/dL (ref 3.2–5.6)
ALBUMIN/GLOB SERPL: 1.5 (ref 0.7–2.0)
ALPHA 1: 0.2 g/dL (ref 0.1–0.4)
Alpha2 Glob SerPl Elph-Mcnc: 0.6 g/dL (ref 0.4–1.2)
B-GLOBULIN SERPL ELPH-MCNC: 1 g/dL (ref 0.6–1.3)
GAMMA GLOB SERPL ELPH-MCNC: 1.1 g/dL (ref 0.5–1.6)
Globulin, Total: 2.9 g/dL (ref 2.0–4.5)
IGG (IMMUNOGLOBIN G), SERUM: 1284 mg/dL (ref 700–1600)
IgA/Immunoglobulin A, Serum: 57 mg/dL — ABNORMAL LOW (ref 91–414)
IgM (Immunoglobulin M), Srm: 119 mg/dL (ref 40–230)
Total Protein: 7.1 g/dL (ref 6.0–8.5)

## 2013-12-05 LAB — SEDIMENTATION RATE: Sed Rate: 8 mm/hr (ref 0–32)

## 2013-12-05 LAB — LYME, TOTAL AB TEST/REFLEX

## 2013-12-05 LAB — CK: CK TOTAL: 76 U/L (ref 24–173)

## 2013-12-05 LAB — NMO IGG AUTOANTIBODIES: NMO-IgG: <1.6 U/mL (ref 0.0–3.0)

## 2013-12-05 LAB — VITAMIN D 1,25 DIHYDROXY: Vit D, 1,25-Dihydroxy: 107.5 pg/mL — ABNORMAL HIGH (ref 10.0–75.0)

## 2013-12-05 LAB — RPR: RPR: NONREACTIVE

## 2013-12-05 LAB — VITAMIN B12: VITAMIN B 12: 1047 pg/mL — AB (ref 211–946)

## 2013-12-05 LAB — ANA W/REFLEX IF POSITIVE: ANA: NEGATIVE

## 2013-12-10 ENCOUNTER — Encounter: Payer: BC Managed Care – PPO | Admitting: Neurology

## 2013-12-14 ENCOUNTER — Ambulatory Visit
Admission: RE | Admit: 2013-12-14 | Discharge: 2013-12-14 | Disposition: A | Discharge status: Home / Self Care | Payer: BC Managed Care – PPO | Source: Ambulatory Visit | Attending: Neurology | Admitting: Neurology

## 2013-12-14 DIAGNOSIS — R209 Unspecified disturbances of skin sensation: Secondary | ICD-10-CM

## 2013-12-14 DIAGNOSIS — R202 Paresthesia of skin: Secondary | ICD-10-CM

## 2013-12-17 ENCOUNTER — Telehealth: Payer: Self-pay | Admitting: Neurology

## 2013-12-17 NOTE — Telephone Encounter (Signed)
Lupita LeashDonna:  Please call patient, MRI of cervical spine showed degenerative disc disease, most  severe at C5 and 6, but there was no significant canal stenosis, will not explain her symptoms.  MRI of the brain was normal

## 2013-12-19 NOTE — Telephone Encounter (Signed)
Spoke to patient and relayed MRI brain and MRI cervical results, per Dr. Terrace ArabiaYan.  Patient expressed understanding, and said she is actually feeling better, and will call if necessary for a follow up.

## 2014-03-26 ENCOUNTER — Other Ambulatory Visit: Payer: Self-pay | Admitting: Obstetrics and Gynecology

## 2014-04-10 ENCOUNTER — Ambulatory Visit (INDEPENDENT_AMBULATORY_CARE_PROVIDER_SITE_OTHER): Payer: Managed Care, Other (non HMO) | Admitting: Family Medicine

## 2014-04-10 DIAGNOSIS — J029 Acute pharyngitis, unspecified: Secondary | ICD-10-CM

## 2014-04-10 LAB — POCT RAPID STREP A (OFFICE): Rapid Strep A Screen: NEGATIVE

## 2014-04-10 NOTE — Progress Notes (Signed)
Danielle SessionsBernadette P Bo is a 43 y.o. female who presents to Urgent Care today with complaints of sore throat:  1.  Sore throat:  Son with the same, diagnosed with Strep Throat.  Started yesterday.  Has some mild nasal drainage.  No cough.  No fevers/chills.  Eating and drinking well.    PMH reviewed.  Past Medical History  Diagnosis Date  . History of chicken pox     age 43  . History of recurrent UTIs     age 43-25 > 100   Past Surgical History  Procedure Laterality Date  . Ovary surgery  1998    Right ovary removed  . Cesarean section  07/03/2010    Medications reviewed. Current Outpatient Prescriptions  Medication Sig Dispense Refill  . levonorgestrel (MIRENA) 20 MCG/24HR IUD 1 each by Intrauterine route once.        . Multiple Vitamin (MULTIVITAMIN) tablet Take 1 tablet by mouth daily.         No current facility-administered medications for this visit.    ROS as above otherwise neg.  No chest pain, palpitations, SOB, Fever, Chills, Abd pain, N/V/D.   Physical Exam:  There were no vitals taken for this visit. Gen:  Patient sitting on exam table, appears stated age in no acute distress Head: Normocephalic atraumatic Mouth: Mucosa membranes moist. Tonsils +2, nonenlarged, very minimally erythematous posterior oropharynz.  Neck: No cervical lymphadenopathy noted  Results for orders placed in visit on 04/10/14  POCT RAPID STREP A (OFFICE)      Result Value Ref Range   Rapid Strep A Screen Negative  Negative     Assessment and Plan:  1.  Viral pharyngitis: - low risk for strep - negative strep swab. - symptomatic treatment.

## 2015-04-07 ENCOUNTER — Other Ambulatory Visit: Payer: Self-pay | Admitting: Obstetrics and Gynecology

## 2015-04-08 LAB — CYTOLOGY - PAP

## 2017-08-07 ENCOUNTER — Ambulatory Visit (INDEPENDENT_AMBULATORY_CARE_PROVIDER_SITE_OTHER): Payer: Worker's Compensation | Admitting: Family Medicine

## 2017-08-07 ENCOUNTER — Ambulatory Visit (HOSPITAL_BASED_OUTPATIENT_CLINIC_OR_DEPARTMENT_OTHER)
Admission: RE | Admit: 2017-08-07 | Discharge: 2017-08-07 | Disposition: A | Discharge status: Home / Self Care | Payer: Worker's Compensation | Source: Ambulatory Visit | Attending: Family Medicine | Admitting: Family Medicine

## 2017-08-07 ENCOUNTER — Encounter: Payer: Self-pay | Admitting: Family Medicine

## 2017-08-07 VITALS — BP 140/107 | HR 127 | Ht 62.0 in | Wt 200.0 lb

## 2017-08-07 DIAGNOSIS — S6992XA Unspecified injury of left wrist, hand and finger(s), initial encounter: Secondary | ICD-10-CM | POA: Insufficient documentation

## 2017-08-07 DIAGNOSIS — M20012 Mallet finger of left finger(s): Secondary | ICD-10-CM | POA: Insufficient documentation

## 2017-08-07 DIAGNOSIS — X58XXXA Exposure to other specified factors, initial encounter: Secondary | ICD-10-CM | POA: Insufficient documentation

## 2017-08-07 NOTE — Patient Instructions (Signed)
You have a mallet finger of your pinky finger. It's critical that this finger stays in extension at all times. Wear the brace as shown and remove to wash the area as shown. Tylenol, ibuprofen if needed for pain. Follow up with me in 2 weeks. Expect to splint for 6 weeks regularly and 2 weeks at nighttime.

## 2017-08-08 DIAGNOSIS — M20012 Mallet finger of left finger(s): Secondary | ICD-10-CM | POA: Insufficient documentation

## 2017-08-08 NOTE — Assessment & Plan Note (Signed)
ordered and independently reviewed radiographs and no evidence fracture.  Consistent with mallet finger.  Extension splint applied and stressed importance of keeping in extension for full 6 weeks.  Tylenol, ibuprofen if needed.  F/u in 2 weeks for reevaluation.

## 2017-08-08 NOTE — Progress Notes (Signed)
PCP: [name], [name], NP  Subjective:   HPI: Patient is a 46 y.o. female here for left finger injury.  Patient reports about 1 hour prior to arrival she was filling a refrigerator with soda cans - was getting more cans out of the box and reached in too far, struck tip of left little finger on can and thinks tip of finger forcibly flexed. Immediate sharp pain dorsally. Pain 4/10 with some increasing swelling since then. No prior injuries. Has deformity of this finger now. No other skin changes. No numbness.  Past Medical History:  Diagnosis Date  . History of chicken pox    age 46  . History of recurrent UTIs    age [age]-25 > 100    Current Outpatient Prescriptions on File Prior to Visit  Medication Sig Dispense Refill  . levonorgestrel (MIRENA) 20 MCG/24HR IUD 1 each by Intrauterine route once.      . Multiple Vitamin (MULTIVITAMIN) tablet Take 1 tablet by mouth daily.       No current facility-administered medications on file prior to visit.     Past Surgical History:  Procedure Laterality Date  . CESAREAN SECTION  07/03/2010  . OVARY SURGERY  1998   Right ovary removed    No Known Allergies  Social History   Social History  . Marital status: Married    Spouse name: N/A  . Number of children: N/A  . Years of education: N/A   Occupational History  . Not on file.   Social History Main Topics  . Smoking status: Never Smoker  . Smokeless tobacco: Never Used  . Alcohol use Yes  . Drug use: No  . Sexual activity: Not on file   Other Topics Concern  . Not on file   Social History Narrative  . No narrative on file    Family History  Problem Relation Age of Onset  . Drug abuse Father        died cocaine overdose  . Ovarian cancer Paternal Grandmother   . Emphysema Paternal Grandmother   . Breast cancer Maternal Aunt   . Emphysema Paternal Grandfather   . Stroke Maternal Grandfather   . Hypertension Maternal Grandfather   . Hypertension Mother   .  Diabetes type II Mother   . Hypertension Maternal Grandmother     BP (!) 140/107   Pulse (!) 127   Ht 5\' 2"  (1.575 m)   Wt 200 lb (90.7 kg)   BMI 36.58 kg/m   Review of Systems: See HPI above.     Objective:  Physical Exam:  Gen: NAD, comfortable in exam room  Left 5th digit: 90 degree flexion deformity of DIP.  No malrotation.  No other deformity.  No bruising.  Mild swelling. TTP mildly dorsal DIP.  No PIP, MCP, other tenderness. FROM PIP, MCP. Unable to actively extend at DIP but with 5/5 strength flexion. Collateral ligaments intact. NVI distally with < 2 sec cap refill.  Right hand: FROM digits with 5/5 strength. No malrotation, subluxation, or angulation. < 2 sec cap refill, NVI distally.   Assessment & Plan:  1. Left 5th digit mallet finger - ordered and independently reviewed radiographs and no evidence fracture.  Consistent with mallet finger.  Extension splint applied and stressed importance of keeping in extension for full 6 weeks.  Tylenol, ibuprofen if needed.  F/u in 2 weeks for reevaluation.

## 2017-08-21 ENCOUNTER — Encounter: Payer: Self-pay | Admitting: Family Medicine

## 2017-08-21 ENCOUNTER — Ambulatory Visit (INDEPENDENT_AMBULATORY_CARE_PROVIDER_SITE_OTHER): Payer: Worker's Compensation | Admitting: Family Medicine

## 2017-08-21 DIAGNOSIS — M20012 Mallet finger of left finger(s): Secondary | ICD-10-CM

## 2017-08-22 ENCOUNTER — Encounter: Payer: Self-pay | Admitting: Family Medicine

## 2017-08-22 NOTE — Progress Notes (Signed)
PCP: [name], [name], NP  Subjective:   HPI: Patient is a 46 y.o. female here for left finger injury.  10/22: Patient reports about 1 hour prior to arrival she was filling a refrigerator with soda cans - was getting more cans out of the box and reached in too far, struck tip of left little finger on can and thinks tip of finger forcibly flexed. Immediate sharp pain dorsally. Pain 4/10 with some increasing swelling since then. No prior injuries. Has deformity of this finger now. No other skin changes. No numbness.  11/5: Patient reports overall she's doing well. She has been compliant with extension splinting of her left 5th digit. Pain is 0/10 though feels a pulling here. Has been washing area and trying to keep it dry - keeping in extension when she does this. No numbness.  Past Medical History:  Diagnosis Date  . History of chicken pox    age 46  . History of recurrent UTIs    age 46-25 > 100    Current Outpatient Medications on File Prior to Visit  Medication Sig Dispense Refill  . BLISOVI 24 FE 1-20 MG-MCG(24) tablet Take 1 tablet by mouth daily.  0  . levonorgestrel (MIRENA) 20 MCG/24HR IUD 1 each by Intrauterine route once.      . Multiple Vitamin (MULTIVITAMIN) tablet Take 1 tablet by mouth daily.      . phentermine (ADIPEX-P) 37.5 MG tablet Take 37.5 mg by mouth every morning.  0   No current facility-administered medications on file prior to visit.     Past Surgical History:  Procedure Laterality Date  . CESAREAN SECTION  07/03/2010  . OVARY SURGERY  1998   Right ovary removed    No Known Allergies  Social History   Socioeconomic History  . Marital status: Married    Spouse name: Not on file  . Number of children: Not on file  . Years of education: Not on file  . Highest education level: Not on file  Social Needs  . Financial resource strain: Not on file  . Food insecurity - worry: Not on file  . Food insecurity - inability: Not on file  .  Transportation needs - medical: Not on file  . Transportation needs - non-medical: Not on file  Occupational History  . Not on file  Tobacco Use  . Smoking status: Never Smoker  . Smokeless tobacco: Never Used  Substance and Sexual Activity  . Alcohol use: Yes  . Drug use: No  . Sexual activity: Not on file  Other Topics Concern  . Not on file  Social History Narrative  . Not on file    Family History  Problem Relation Age of Onset  . Drug abuse Father        died cocaine overdose  . Ovarian cancer Paternal Grandmother   . Emphysema Paternal Grandmother   . Breast cancer Maternal Aunt   . Emphysema Paternal Grandfather   . Stroke Maternal Grandfather   . Hypertension Maternal Grandfather   . Hypertension Mother   . Diabetes type II Mother   . Hypertension Maternal Grandmother     BP 123/72   Pulse (!) 132   Ht 5\' 2"  (1.575 m)   Wt 195 lb (88.5 kg)   BMI 35.67 kg/m   Review of Systems: See HPI above.     Objective:  Physical Exam:  Gen: NAD, comfortable in exam room.  Left 5th digit: Maintained in extension throughout visit.  No malrotation  or angulation.  No swelling, bruising.  Skin appears more white but no erythema, breakdown.  No drainage. TTP mildly dorsal DIP. Did not test ROM today as only 2 weeks out. Collateral ligaments intact. NVI distally.  Right hand: FROM digits without deformity.   Assessment & Plan:  1. Left 5th digit mallet finger - Radiographs were negative.  Compliant with extension splinting though concerned finger is staying too wet in this splint or having a reaction to the splint.  U shaped padded aluminum splint provided.  Going to obtain oval 8 splint for her to use instead.  F/u in 4 weeks for reevaluation.  Tylenol if needed.

## 2017-08-22 NOTE — Assessment & Plan Note (Signed)
Radiographs were negative.  Compliant with extension splinting though concerned finger is staying too wet in this splint or having a reaction to the splint.  U shaped padded aluminum splint provided.  Going to obtain oval 8 splint for her to use instead.  F/u in 4 weeks for reevaluation.  Tylenol if needed.

## 2017-09-18 ENCOUNTER — Ambulatory Visit (INDEPENDENT_AMBULATORY_CARE_PROVIDER_SITE_OTHER): Payer: Worker's Compensation | Admitting: Family Medicine

## 2017-09-18 DIAGNOSIS — M20012 Mallet finger of left finger(s): Secondary | ICD-10-CM

## 2017-09-18 NOTE — Patient Instructions (Signed)
Wear the splint for 2 more weeks regularly then just at bedtime for 2 more weeks. Start occupational therapy in 2 weeks to regain motion and strength. Follow up with me in 4 weeks unless you're having a problem then call me sooner with any questions.

## 2017-09-19 ENCOUNTER — Encounter: Payer: Self-pay | Admitting: Family Medicine

## 2017-09-19 NOTE — Assessment & Plan Note (Signed)
Improving and now with strength on extension.  Advised we continue with 2 weeks of extension before starting nighttime splinting for 2 more weeks.  Start occupational therapy in 2 weeks as well.  F/u in 4 weeks.  Tylenol, ibuprofen if needed.

## 2017-09-19 NOTE — Progress Notes (Signed)
PCP: [name], [name], NP  Subjective:   HPI: Patient is a 46 y.o. female here for left finger injury.  10/22: Patient reports about 1 hour prior to arrival she was filling a refrigerator with soda cans - was getting more cans out of the box and reached in too far, struck tip of left little finger on can and thinks tip of finger forcibly flexed. Immediate sharp pain dorsally. Pain 4/10 with some increasing swelling since then. No prior injuries. Has deformity of this finger now. No other skin changes. No numbness.  11/5: Patient reports overall she's doing well. She has been compliant with extension splinting of her left 5th digit. Pain is 0/10 though feels a pulling here. Has been washing area and trying to keep it dry - keeping in extension when she does this. No numbness.  12/3: Patient reports she's doing well. No pain. Complaint with extension splint. No numbness ,skin changes. Still a little swelling.  Past Medical History:  Diagnosis Date  . History of chicken pox    age 46  . History of recurrent UTIs    age 46-25 > 100    Current Outpatient Medications on File Prior to Visit  Medication Sig Dispense Refill  . BLISOVI 24 FE 1-20 MG-MCG(24) tablet Take 1 tablet by mouth daily.  0  . levonorgestrel (MIRENA) 20 MCG/24HR IUD 1 each by Intrauterine route once.      . Multiple Vitamin (MULTIVITAMIN) tablet Take 1 tablet by mouth daily.      . phentermine (ADIPEX-P) 37.5 MG tablet Take 37.5 mg by mouth every morning.  0   No current facility-administered medications on file prior to visit.     Past Surgical History:  Procedure Laterality Date  . CESAREAN SECTION  07/03/2010  . OVARY SURGERY  1998   Right ovary removed    No Known Allergies  Social History   Socioeconomic History  . Marital status: Married    Spouse name: Not on file  . Number of children: Not on file  . Years of education: Not on file  . Highest education level: Not on file  Social  Needs  . Financial resource strain: Not on file  . Food insecurity - worry: Not on file  . Food insecurity - inability: Not on file  . Transportation needs - medical: Not on file  . Transportation needs - non-medical: Not on file  Occupational History  . Not on file  Tobacco Use  . Smoking status: Never Smoker  . Smokeless tobacco: Never Used  Substance and Sexual Activity  . Alcohol use: Yes  . Drug use: No  . Sexual activity: Not on file  Other Topics Concern  . Not on file  Social History Narrative  . Not on file    Family History  Problem Relation Age of Onset  . Drug abuse Father        died cocaine overdose  . Ovarian cancer Paternal Grandmother   . Emphysema Paternal Grandmother   . Breast cancer Maternal Aunt   . Emphysema Paternal Grandfather   . Stroke Maternal Grandfather   . Hypertension Maternal Grandfather   . Hypertension Mother   . Diabetes type II Mother   . Hypertension Maternal Grandmother     BP 124/90   Pulse (!) 105   Ht 5\' 4"  (1.626 m)   Wt 208 lb (94.3 kg)   BMI 35.70 kg/m   Review of Systems: See HPI above.     Objective:  Physical Exam:  Gen: NAD, comfortable in exam room.  Left 5th digit: Maintained in extension.  No malrotation or angulation.  Mild swelling dorsally over DIP joint.  No skin breakdown, erythema, sores.   Minimal TTP dorsally over DIP. Able to resist extension with 4/5 strength, did not test flexion. Collateral ligaments intact. NVI distally.   Assessment & Plan:  1. Left 5th digit mallet finger - Improving and now with strength on extension.  Advised we continue with 2 weeks of extension before starting nighttime splinting for 2 more weeks.  Start occupational therapy in 2 weeks as well.  F/u in 4 weeks.  Tylenol, ibuprofen if needed.

## 2017-09-19 NOTE — Addendum Note (Signed)
Addended by: Kathi SimpersWISE, [place] F on: 09/19/2017 12:08 PM   Modules accepted: Orders

## 2017-10-05 ENCOUNTER — Telehealth: Payer: Self-pay | Admitting: Family Medicine

## 2017-10-05 NOTE — Telephone Encounter (Signed)
Please go ahead with order for this - they usually can fashion a custom splint there for her.  Thanks!

## 2017-10-05 NOTE — Telephone Encounter (Signed)
Marchelle FolksAmanda (Occupational therapist) called from [facility]. We referred patient to her for mallet finger to work on motion however, occupational therapist states she now has swan neck deformity and is in need of a splint. Is requesting a script for a splint to be faxed to her office at [phone].   Patient has an appointment tomorrow.

## 2017-10-06 NOTE — Telephone Encounter (Signed)
Script sent  

## 2017-10-20 ENCOUNTER — Ambulatory Visit (INDEPENDENT_AMBULATORY_CARE_PROVIDER_SITE_OTHER): Payer: Worker's Compensation | Admitting: Family Medicine

## 2017-10-20 ENCOUNTER — Encounter: Payer: Self-pay | Admitting: Family Medicine

## 2017-10-20 DIAGNOSIS — M20012 Mallet finger of left finger(s): Secondary | ICD-10-CM

## 2017-10-20 NOTE — Patient Instructions (Signed)
Continue with therapy as they work on transitioning you out of the splint with increased strengthening. Follow up with me after you're done with therapy.

## 2017-10-25 ENCOUNTER — Encounter: Payer: Self-pay | Admitting: Family Medicine

## 2017-10-25 NOTE — Progress Notes (Signed)
PCP: [name], [name], NP  Subjective:   HPI: Patient is a 47 y.o. female here for left finger injury.  10/22: Patient reports about 1 hour prior to arrival she was filling a refrigerator with soda cans - was getting more cans out of the box and reached in too far, struck tip of left little finger on can and thinks tip of finger forcibly flexed. Immediate sharp pain dorsally. Pain 4/10 with some increasing swelling since then. No prior injuries. Has deformity of this finger now. No other skin changes. No numbness.  11/5: Patient reports overall she's doing well. She has been compliant with extension splinting of her left 5th digit. Pain is 0/10 though feels a pulling here. Has been washing area and trying to keep it dry - keeping in extension when she does this. No numbness.  12/3: Patient reports she's doing well. No pain. Complaint with extension splint. No numbness ,skin changes. Still a little swelling.  10/20/17: Patient reports she's doing well. Has done a couple visits in OT and is wearing custom splint from them. Has some mild pain with exercising but none at rest. Pain currently 0/10. No skin changes, numbness.  Past Medical History:  Diagnosis Date  . History of chicken pox    age 47  . History of recurrent UTIs    age 47-25 > 100    Current Outpatient Medications on File Prior to Visit  Medication Sig Dispense Refill  . BLISOVI 24 FE 1-20 MG-MCG(24) tablet Take 1 tablet by mouth daily.  0  . levonorgestrel (MIRENA) 20 MCG/24HR IUD 1 each by Intrauterine route once.      . Multiple Vitamin (MULTIVITAMIN) tablet Take 1 tablet by mouth daily.      . phentermine (ADIPEX-P) 37.5 MG tablet Take 37.5 mg by mouth every morning.  0   No current facility-administered medications on file prior to visit.     Past Surgical History:  Procedure Laterality Date  . CESAREAN SECTION  07/03/2010  . OVARY SURGERY  1998   Right ovary removed    No Known  Allergies  Social History   Socioeconomic History  . Marital status: Married    Spouse name: Not on file  . Number of children: Not on file  . Years of education: Not on file  . Highest education level: Not on file  Social Needs  . Financial resource strain: Not on file  . Food insecurity - worry: Not on file  . Food insecurity - inability: Not on file  . Transportation needs - medical: Not on file  . Transportation needs - non-medical: Not on file  Occupational History  . Not on file  Tobacco Use  . Smoking status: Never Smoker  . Smokeless tobacco: Never Used  Substance and Sexual Activity  . Alcohol use: Yes  . Drug use: No  . Sexual activity: Not on file  Other Topics Concern  . Not on file  Social History Narrative  . Not on file    Family History  Problem Relation Age of Onset  . Drug abuse Father        died cocaine overdose  . Ovarian cancer Paternal Grandmother   . Emphysema Paternal Grandmother   . Breast cancer Maternal Aunt   . Emphysema Paternal Grandfather   . Stroke Maternal Grandfather   . Hypertension Maternal Grandfather   . Hypertension Mother   . Diabetes type II Mother   . Hypertension Maternal Grandmother     BP 133/81  Pulse (!) 105   Ht 5\' 2"  (1.575 m)   Wt 195 lb (88.5 kg)   BMI 35.67 kg/m   Review of Systems: See HPI above.     Objective:  Physical Exam:  Gen: NAD, comfortable in exam room.  Left 5th digit: Kept in extension through visit.  No malrotation.  Mild extension at PIP.   No TTP throughout.  No skin changes, rash, breakdown. Strength 5/5 with extension at PIP and DIP joints though. Collateral ligaments intact. NVI distally.   Assessment & Plan:  1. Left 5th digit mallet finger - continues to improve clinically.  Continue with occupational therapy and transition out of splint with increased strengthening, reduction of mild swan neck.  F/u when completed physical therapy.  Tylenol if needed for pain.

## 2017-10-25 NOTE — Assessment & Plan Note (Signed)
continues to improve clinically.  Continue with occupational therapy and transition out of splint with increased strengthening, reduction of mild swan neck.  F/u when completed physical therapy.  Tylenol if needed for pain.

## 2017-11-17 ENCOUNTER — Ambulatory Visit: Payer: Worker's Compensation | Admitting: Family Medicine

## 2018-09-03 ENCOUNTER — Ambulatory Visit: Payer: Self-pay

## 2018-09-03 NOTE — Telephone Encounter (Signed)
Pt. Called to report multiple complaints.  Reported she was at GYN appt. Today, and was noted to have a lot of blood in urinalysis; not visible otherwise.  Reported she is being referred to a Urologist for this issue.  Pt. reported multiple chronic problems.  Stated she has had upper abdominal pain on upper left abdomen, beneath breast and toward middle of abdomen.  Denied chest pain, but stated she has had chest pain in past, and her heart checked out fine.  Stated the abdominal pain is present most of the time at 6-7/10, since approx. Jan. 2019.  Described the pain as a "constant dull, achy discomfort".  Stated she has had about 4-5 days this year, that the abdominal pain was so bad, she stayed in bed most of the day.  Reported she has had diarrhea since August; has diarrhea 2-3 times / week and describes as "mucousy stool that is pale yellow to dark brown." Denied any blood in stool.  Also, reported weight gain of about 40 lbs. over past year.  C/o loss of interest in eating.  Stated she still eats, but doesn't really have much desire to eat; not hungry.  Stated she gags more easily, when brushing her teeth.  Stated she has some nausea, but does not vomit. C/o feeling "very tired and fatigued."  Was advised per GYN today, to sched. appt. to get re-established with Primary Care.  Pt. Requested a new provider.  New patient appt. scheduled for 09/11/18.  Care advice given per protocol.      Reason for Disposition . Abdominal pain is a chronic symptom (recurrent or ongoing AND present > 4 weeks)  Answer Assessment - Initial Assessment Questions 1. LOCATION: "Where does it hurt?"      Upper abdominal pain under left breast to middle abdomen 2. RADIATION: "Does the pain shoot anywhere else?" (e.g., chest, back)     Sometimes radiates through entire body  3. ONSET: "When did the pain begin?" (e.g., minutes, hours or days ago)      January 2019  4. SUDDEN: "Gradual or sudden onset?"     Sudden 5. PATTERN  "Does the pain come and go, or is it constant?"    - If constant: "Is it getting better, staying the same, or worsening?"      (Note: Constant means the pain never goes away completely; most serious pain is constant and it progresses)     - If intermittent: "How long does it last?" "Do you have pain now?"     (Note: Intermittent means the pain goes away completely between bouts)     Constant dull, achy discomfort 6. SEVERITY: "How bad is the pain?"  (e.g., Scale 1-10; mild, moderate, or severe)   - MILD (1-3): doesn't interfere with normal activities, abdomen soft and not tender to touch    - MODERATE (4-7): interferes with normal activities or awakens from sleep, tender to touch    - SEVERE (8-10): excruciating pain, doubled over, unable to do any normal activities      6-7/10 7. RECURRENT SYMPTOM: "Have you ever had this type of abdominal pain before?" If so, ask: "When was the last time?" and "What happened that time?"      unknown 8. CAUSE: "What do you think is causing the abdominal pain?"     no 9. RELIEVING/AGGRAVATING FACTORS: "What makes it better or worse?" (e.g., movement, antacids, bowel movement)     Takes Tums freq. for the abd. Discomfort; takes one bottle /  month 10. OTHER SYMPTOMS: "Has there been any vomiting, diarrhea, constipation, or urine problems?"      Feels lethargic, tired, loss of appetite, weight gain, diarrhea stools 2-3 times/week; mucus pale yellow to dark brown, gags more easily, blood in urine per UA-no visible blood  11. PREGNANCY: "Is there any chance you are pregnant?" "When was your last menstrual period?"       Tubal ligation  Protocols used: ABDOMINAL PAIN - [facility]PP[facility], [facility]

## 2018-09-04 ENCOUNTER — Other Ambulatory Visit: Payer: Self-pay | Admitting: Obstetrics and Gynecology

## 2018-09-04 DIAGNOSIS — R928 Other abnormal and inconclusive findings on diagnostic imaging of breast: Secondary | ICD-10-CM

## 2018-09-07 ENCOUNTER — Ambulatory Visit: Payer: Self-pay

## 2018-09-07 ENCOUNTER — Other Ambulatory Visit: Payer: Self-pay | Admitting: Obstetrics and Gynecology

## 2018-09-07 ENCOUNTER — Ambulatory Visit
Admission: RE | Admit: 2018-09-07 | Discharge: 2018-09-07 | Disposition: A | Discharge status: Home / Self Care | Payer: Self-pay | Source: Ambulatory Visit | Attending: Obstetrics and Gynecology | Admitting: Obstetrics and Gynecology

## 2018-09-07 ENCOUNTER — Ambulatory Visit
Admission: RE | Admit: 2018-09-07 | Discharge: 2018-09-07 | Disposition: A | Discharge status: Home / Self Care | Payer: Managed Care, Other (non HMO) | Source: Ambulatory Visit | Attending: Obstetrics and Gynecology | Admitting: Obstetrics and Gynecology

## 2018-09-07 DIAGNOSIS — N6001 Solitary cyst of right breast: Secondary | ICD-10-CM

## 2018-09-07 DIAGNOSIS — R928 Other abnormal and inconclusive findings on diagnostic imaging of breast: Secondary | ICD-10-CM

## 2018-09-11 ENCOUNTER — Ambulatory Visit (INDEPENDENT_AMBULATORY_CARE_PROVIDER_SITE_OTHER): Payer: Managed Care, Other (non HMO) | Admitting: Family Medicine

## 2018-09-11 ENCOUNTER — Encounter: Payer: Self-pay | Admitting: Family Medicine

## 2018-09-11 VITALS — BP 120/70 | HR 99 | Temp 98.4°F | Ht 62.0 in | Wt 217.4 lb

## 2018-09-11 DIAGNOSIS — R3121 Asymptomatic microscopic hematuria: Secondary | ICD-10-CM | POA: Insufficient documentation

## 2018-09-11 DIAGNOSIS — R1013 Epigastric pain: Secondary | ICD-10-CM

## 2018-09-11 DIAGNOSIS — R569 Unspecified convulsions: Secondary | ICD-10-CM | POA: Insufficient documentation

## 2018-09-11 DIAGNOSIS — M542 Cervicalgia: Secondary | ICD-10-CM | POA: Insufficient documentation

## 2018-09-11 DIAGNOSIS — R197 Diarrhea, unspecified: Secondary | ICD-10-CM

## 2018-09-11 NOTE — Progress Notes (Signed)
Chief Complaint  Patient presents with  . New Patient (Initial Visit)       New Patient Visit SUBJECTIVE: HPI: Danielle Mays is an 47 y.o.female who is being seen for establishing care.  The patient has not had pcp in quite some time.  1 yr has been having epigastric pain that comes and goes. Assoc diarrhea, has appt with GI today. No bleeding. No a/w specific types of meals. Wt gain. Describes as pressure (not easy to describe), pain comes 5-6 times per year. Not a/w position. No N/v, fevers. Has tried Tums that helps a little.   Hematuria- Has appt w urology  Neck has been going on for 3-4 years. Saw ENT in past, sent to Neuro. Saw Dr. 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 Worse on R side. No inj or change in activity that started issue. No longer have numbness/tingling in hands. Also describes 2 episodes where she felt paralyzed.  The first happened in 2009 when she was lying down.  It was just after the birth of her daughter.  She was aware of what was going on, but physically could not move.  She felt out of it afterwards.  The same thing happened in 2016 while she was on a flight.  She did not lose consciousness or control of her bowel/bladder function.  She has never told her neurologist this issue.  No Known Allergies  Past Medical History:  Diagnosis Date  . History of chicken pox    age 47  . History of recurrent UTIs    age 47-25 > 100   Past Surgical History:  Procedure Laterality Date  . CESAREAN SECTION  07/03/2010  . OVARY SURGERY  1998   Right ovary removed  . TUBAL LIGATION     Family History  Problem Relation Age of Onset  . Drug abuse Father        died cocaine overdose  . Hypertension Mother   . Diabetes type II Mother   . Ovarian cancer Paternal Grandmother   . Emphysema Paternal Grandmother   . Breast cancer Maternal Aunt   . Emphysema Paternal Grandfather   . Stroke Maternal Grandfather   . Hypertension Maternal Grandfather   . Hypertension  Maternal Grandmother    No Known Allergies  Current Outpatient Medications:  .  BLISOVI 24 FE 1-20 MG-MCG(24) tablet, Take 1 tablet by mouth daily., Disp: , Rfl: 0  No LMP recorded. (Menstrual status: Oral contraceptives).  ROS MSK: +neck pain  Neuro: As noted in HPI   OBJECTIVE: BP 120/70 (BP Location: Left Arm, Patient Position: Sitting, Cuff Size: Normal)   Pulse 99   Temp 98.4 F (36.9 C) (Oral)   Ht 5\' 2"  (1.575 m)   Wt 217 lb 6 oz (98.6 kg)   SpO2 96%   BMI 39.76 kg/m   Constitutional: -  VS reviewed -  Well developed, well nourished, appears stated age -  No apparent distress  Psychiatric: -  Oriented to person, place, and time -  Memory intact -  Affect and mood normal -  Fluent conversation, good eye contact -  Judgment and insight age appropriate  Eye: -  Conjunctivae clear, no discharge -  Pupils symmetric, round, reactive to light  ENMT: -  MMM    Pharynx moist, no exudate, no erythema  Neck: -  No gross swelling, no palpable masses -  Thyroid midline, not enlarged, mobile, no palpable masses  Cardiovascular: -  RRR -  No LE edema  Respiratory: -  Normal respiratory effort, no accessory muscle use, no retraction -  Breath sounds equal, no wheezes, no ronchi, no crackles  Gastrointestinal: -  Bowel sounds normal -  Diffuse ttp, worse in epigastric region, no distention, no guarding, no masses  Neurological:  -  CN II - XII grossly intact -  DTR's equal and symmetric in UE's -  5/5 strength throughout -  Neg Spurling's b/l -  Sensation grossly intact to light touch, equal bilaterally  Musculoskeletal: -  No clubbing, no cyanosis -  Gait normal -  +TTP over subocc triangle and R SCM, mild ttp over cerv parasp msc b/l  Skin: -  No significant lesion on inspection -  Warm and dry to palpation   ASSESSMENT/PLAN: Epigastric pain  Diarrhea, unspecified type  Seizure-like activity (HCC) - Plan: Ambulatory referral to Neurology  Neck  pain  Asymptomatic microscopic hematuria  Patient has appointment with gastroenterology today.  I suspect reflux/PUD.  Will defer any work-up to the gastroenterology team. Refer back to neurology for above. For her neck, I feel her main issue is muscular skeletal at this time.  Heat, Tylenol, and stretches/exercises recommended. Patient has an appointment with urology in around 5 weeks. Patient should return in 6 months for a physical. The patient voiced understanding and agreement to the plan.   Jilda Roche [place], DO 09/11/18  8:40 AM

## 2018-09-11 NOTE — Progress Notes (Signed)
Pre visit review using our clinic review tool, if applicable. No additional management support is needed unless otherwise documented below in the visit note. 

## 2018-09-11 NOTE — Patient Instructions (Addendum)
If you do not hear anything about your referral in the next 1-2 weeks, call our office and ask for an update.  Heat (pad or rice pillow in microwave) over affected area, 10-15 minutes twice daily.   EXERCISES RANGE OF MOTION (ROM) AND STRETCHING EXERCISES  These exercises may help you when beginning to rehabilitate your issue. In order to successfully resolve your symptoms, you must improve your posture. These exercises are designed to help reduce the forward-head and rounded-shoulder posture which contributes to this condition. Your symptoms may resolve with or without further involvement from your physician, physical therapist or athletic trainer. While completing these exercises, remember:   Restoring tissue flexibility helps normal motion to return to the joints. This allows healthier, less painful movement and activity.  An effective stretch should be held for at least 20 seconds, although you may need to begin with shorter hold times for comfort.  A stretch should never be painful. You should only feel a gentle lengthening or release in the stretched tissue.  Do not do any stretch or exercise that you cannot tolerate.  STRETCH- Axial Extensors  Lie on your back on the floor. You may bend your knees for comfort. Place a rolled-up hand towel or dish towel, about 2 inches in diameter, under the part of your head that makes contact with the floor.  Gently tuck your chin, as if trying to make a "double chin," until you feel a gentle stretch at the base of your head.  Hold 15-20 seconds. Repeat 2-3 times. Complete this exercise 1 time per day.   STRETCH - Axial Extension   Stand or sit on a firm surface. Assume a good posture: chest up, shoulders drawn back, abdominal muscles slightly tense, knees unlocked (if standing) and feet hip width apart.  Slowly retract your chin so your head slides back and your chin slightly lowers. Continue to look straight ahead.  You should feel a gentle  stretch in the back of your head. Be certain not to feel an aggressive stretch since this can cause headaches later.  Hold for 15-20 seconds. Repeat 2-3 times. Complete this exercise 1 time per day.  STRETCH - Cervical Side Bend   Stand or sit on a firm surface. Assume a good posture: chest up, shoulders drawn back, abdominal muscles slightly tense, knees unlocked (if standing) and feet hip width apart.  Without letting your nose or shoulders move, slowly tip your right / left ear to your shoulder until your feel a gentle stretch in the muscles on the opposite side of your neck.  Hold 15-20 seconds. Repeat 2-3 times. Complete this exercise 1-2 times per day.  STRETCH - Cervical Rotators   Stand or sit on a firm surface. Assume a good posture: chest up, shoulders drawn back, abdominal muscles slightly tense, knees unlocked (if standing) and feet hip width apart.  Keeping your eyes level with the ground, slowly turn your head until you feel a gentle stretch along the back and opposite side of your neck.  Hold 15-20 seconds. Repeat 2-3 times. Complete this exercise 1-2 times per day.  RANGE OF MOTION - Neck Circles   Stand or sit on a firm surface. Assume a good posture: chest up, shoulders drawn back, abdominal muscles slightly tense, knees unlocked (if standing) and feet hip width apart.  Gently roll your head down and around from the back of one shoulder to the back of the other. The motion should never be forced or painful.  Repeat   the motion 10-20 times, or until you feel the neck muscles relax and loosen. Repeat 2-3 times. Complete the exercise 1-2 times per day. STRENGTHENING EXERCISES - Cervical Strain and Sprain These exercises may help you when beginning to rehabilitate your injury. They may resolve your symptoms with or without further involvement from your physician, physical therapist, or athletic trainer. While completing these exercises, remember:   Muscles can gain both  the endurance and the strength needed for everyday activities through controlled exercises.  Complete these exercises as instructed by your physician, physical therapist, or athletic trainer. Progress the resistance and repetitions only as guided.  You may experience muscle soreness or fatigue, but the pain or discomfort you are trying to eliminate should never worsen during these exercises. If this pain does worsen, stop and make certain you are following the directions exactly. If the pain is still present after adjustments, discontinue the exercise until you can discuss the trouble with your clinician.  STRENGTH - Cervical Flexors, Isometric  Face a wall, standing about 6 inches away. Place a small pillow, a ball about 6-8 inches in diameter, or a folded towel between your forehead and the wall.  Slightly tuck your chin and gently push your forehead into the soft object. Push only with mild to moderate intensity, building up tension gradually. Keep your jaw and forehead relaxed.  Hold 10 to 20 seconds. Keep your breathing relaxed.  Release the tension slowly. Relax your neck muscles completely before you start the next repetition. Repeat 2-3 times. Complete this exercise 1 time per day.  STRENGTH- Cervical Lateral Flexors, Isometric   Stand about 6 inches away from a wall. Place a small pillow, a ball about 6-8 inches in diameter, or a folded towel between the side of your head and the wall.  Slightly tuck your chin and gently tilt your head into the soft object. Push only with mild to moderate intensity, building up tension gradually. Keep your jaw and forehead relaxed.  Hold 10 to 20 seconds. Keep your breathing relaxed.  Release the tension slowly. Relax your neck muscles completely before you start the next repetition. Repeat 2-3 times. Complete this exercise 1 time per day.  STRENGTH - Cervical Extensors, Isometric   Stand about 6 inches away from a wall. Place a small pillow, a  ball about 6-8 inches in diameter, or a folded towel between the back of your head and the wall.  Slightly tuck your chin and gently tilt your head back into the soft object. Push only with mild to moderate intensity, building up tension gradually. Keep your jaw and forehead relaxed.  Hold 10 to 20 seconds. Keep your breathing relaxed.  Release the tension slowly. Relax your neck muscles completely before you start the next repetition. Repeat 2-3 times. Complete this exercise 1 time per day.  POSTURE AND BODY MECHANICS CONSIDERATIONS Keeping correct posture when sitting, standing or completing your activities will reduce the stress put on different body tissues, allowing injured tissues a chance to heal and limiting painful experiences. The following are general guidelines for improved posture. Your physician or physical therapist will provide you with any instructions specific to your needs. While reading these guidelines, remember:  The exercises prescribed by your provider will help you have the flexibility and strength to maintain correct postures.  The correct posture provides the optimal environment for your joints to work. All of your joints have less wear and tear when properly supported by a spine with good posture. This means   you will experience a healthier, less painful body.  Correct posture must be practiced with all of your activities, especially prolonged sitting and standing. Correct posture is as important when doing repetitive low-stress activities (typing) as it is when doing a single heavy-load activity (lifting).  PROLONGED STANDING WHILE SLIGHTLY LEANING FORWARD When completing a task that requires you to lean forward while standing in one place for a long time, place either foot up on a stationary 2- to 4-inch high object to help maintain the best posture. When both feet are on the ground, the low back tends to lose its slight inward curve. If this curve flattens (or becomes  too large), then the back and your other joints will experience too much stress, fatigue more quickly, and can cause pain.   RESTING POSITIONS Consider which positions are most painful for you when choosing a resting position. If you have pain with flexion-based activities (sitting, bending, stooping, squatting), choose a position that allows you to rest in a less flexed posture. You would want to avoid curling into a fetal position on your side. If your pain worsens with extension-based activities (prolonged standing, working overhead), avoid resting in an extended position such as sleeping on your stomach. Most people will find more comfort when they rest with their spine in a more neutral position, neither too rounded nor too arched. Lying on a non-sagging bed on your side with a pillow between your knees, or on your back with a pillow under your knees will often provide some relief. Keep in mind, being in any one position for a prolonged period of time, no matter how correct your posture, can still lead to stiffness.  WALKING Walk with an upright posture. Your ears, shoulders, and hips should all line up. OFFICE WORK When working at a desk, create an environment that supports good, upright posture. Without extra support, muscles fatigue and lead to excessive strain on joints and other tissues.  CHAIR:  A chair should be able to slide under your desk when your back makes contact with the back of the chair. This allows you to work closely.  The chair's height should allow your eyes to be level with the upper part of your monitor and your hands to be slightly lower than your elbows.  Body position: ? Your feet should make contact with the floor. If this is not possible, use a foot rest. ? Keep your ears over your shoulders. This will reduce stress on your neck and low back.   

## 2018-09-19 ENCOUNTER — Ambulatory Visit (INDEPENDENT_AMBULATORY_CARE_PROVIDER_SITE_OTHER): Payer: Managed Care, Other (non HMO) | Admitting: Neurology

## 2018-09-19 ENCOUNTER — Encounter: Payer: Self-pay | Admitting: Neurology

## 2018-09-19 ENCOUNTER — Telehealth: Payer: Self-pay | Admitting: Neurology

## 2018-09-19 VITALS — BP 127/79 | HR 97 | Ht 62.0 in | Wt 219.0 lb

## 2018-09-19 DIAGNOSIS — M542 Cervicalgia: Secondary | ICD-10-CM

## 2018-09-19 DIAGNOSIS — R413 Other amnesia: Secondary | ICD-10-CM

## 2018-09-19 NOTE — Telephone Encounter (Signed)
[employer] order sent to GI. They obtain the auth and reach out to the pt to schedule.  °

## 2018-09-19 NOTE — Progress Notes (Signed)
PATIENT: Danielle Mays DOB: 10-30-1970  Chief Complaint  Patient presents with  . Seizure-like activity    Report from PCP:  Describes two episodes where she felt paralyzed.  The first happened in 2009 when she was lying down.  It was just after the birth of her daughter.  She was aware of what was going on, but physically could not move.  She felt out of it afterwards.  The same thing happened in 2016 while she was on a flight.  She did not lose consciousness or control of her bowel/bladder function. She has never reported these events or had them evaluated.  Marland Kitchen PCP    Sharlene Dory, DO     HISTORICAL  Danielle Mays is a 47 years old female, seen in request by her primary care physician Dr. Carmelia Roller, Jilda Roche for evaluation of seizure-like activity, initial evaluation was on September 19, 2018.  I have reviewed and summarized the referring note from the referring physician.      She continues with constellation of complaints, complains of cervical, occipital area pain, short-term memory loss, complains of excessive stress,  I saw her initially in February 2015,   In July 2014, she woke up notice right arm tingling, intermittent at the beginning, later she noticed more tingling while holding pen, writing, by August 2014, she noticed both arm, hands numbness tingling.  In September October 2014, she also experienced extreme fatigue, lack of energy, in November 2014,  she began to notice difficulty remembering, focusing.  She already had MRI of the brain, at [facility] imaging, with without contrast, we have looked at the films together, only few subcortical   white matter disease,  most consistent with small vessel disease  She presented with 2 episodes of paralysis, the first episode was in 2009, after birth of her child, she had a transient difficulty walking, lasting for few minutes, there was no persistent sensorimotor loss afterwards, second episode was in  2016 while she was at flight, she has difficulty moving arms and legs lasting for few minutes.  No loss of consciousness  inJan 2019, she was very sick, she did not feel good, very tired, she complains of tinglings, she has upper cerical occipal area headache pain, feel like constant headache,  Visuale evoked potential was normal  She also complains of memory loss, struggling with her job,   Lab result in Nov 2019, LDL 103, normal CMP, A1C5.3, Vit D 27,   REVIEW OF SYSTEMS: Full 14 system review of systems performed and notable only for weight gain, fattgue, feeling hot,increased thirst, memory loss, confusion, headache, numbness, weakness, seizure, insomnia, change in appetite.   All other review of systems were negative.  ALLERGIES: No Known Allergies  HOME MEDICATIONS: Current Outpatient Medications  Medication Sig Dispense Refill  . BLISOVI 24 FE 1-20 MG-MCG(24) tablet Take 1 tablet by mouth daily.  0   No current facility-administered medications for this visit.     PAST MEDICAL HISTORY: Past Medical History:  Diagnosis Date  . Bilateral breast cysts   . History of chicken pox    age 47  . History of recurrent UTIs    age 58-25 > 100  . Seizure-like activity (HCC)     PAST SURGICAL HISTORY: Past Surgical History:  Procedure Laterality Date  . CESAREAN SECTION  07/03/2010  . OVARY SURGERY  1998   Right ovary removed  . TUBAL LIGATION      FAMILY HISTORY: Family History  Problem Relation Age of Onset  . Drug abuse Father        died cocaine overdose   . Hypertension Mother   . Diabetes type II Mother   . Ovarian cancer Paternal Grandmother   . Emphysema Paternal Grandmother   . Alzheimer's disease Paternal Grandmother   . Breast cancer Maternal Aunt   . Emphysema Paternal Grandfather   . Stroke Maternal Grandfather   . Hypertension Maternal Grandfather   . Hypertension Maternal Grandmother     SOCIAL HISTORY: Social History   Socioeconomic History    . Marital status: Married    Spouse name: Not on file  . Number of children: 2  . Years of education: college  . Highest education level: Bachelor's degree (e.g., BA, AB, BS)  Occupational History  . Occupation: Scientist, product/process developmentHuman Resources  Social Needs  . Financial resource strain: Not on file  . Food insecurity:    Worry: Not on file    Inability: Not on file  . Transportation needs:    Medical: Not on file    Non-medical: Not on file  Tobacco Use  . Smoking status: Never Smoker  . Smokeless tobacco: Never Used  Substance and Sexual Activity  . Alcohol use: Yes    Comment: social - twice yearly  . Drug use: No  . Sexual activity: Not on file  Lifestyle  . Physical activity:    Days per week: Not on file    Minutes per session: Not on file  . Stress: Not on file  Relationships  . Social connections:    Talks on phone: Not on file    Gets together: Not on file    Attends religious service: Not on file    Active member of club or organization: Not on file    Attends meetings of clubs or organizations: Not on file    Relationship status: Not on file  . Intimate partner violence:    Fear of current or ex partner: Not on file    Emotionally abused: Not on file    Physically abused: Not on file    Forced sexual activity: Not on file  Other Topics Concern  . Not on file  Social History Narrative   Lives at home with husband and two children.   Right-handed.   1 cup caffeine per day.     PHYSICAL EXAM   Vitals:   09/19/18 0935  BP: 127/79  Pulse: 97  Weight: 219 lb (99.3 kg)  Height: 5\' 2"  (1.575 m)    Not recorded      Body mass index is 40.06 kg/m.  PHYSICAL EXAMNIATION:  Gen: NAD, conversant, well nourised, obese, well groomed                     Cardiovascular: Regular rate rhythm, no peripheral edema, warm, nontender. Eyes: Conjunctivae clear without exudates or hemorrhage Neck: Supple, no carotid bruits. Pulmonary: Clear to auscultation bilaterally    NEUROLOGICAL EXAM:  MENTAL STATUS: Speech:    Speech is normal; fluent and spontaneous with normal comprehension.  Cognition:     Orientation to time, place and person     Normal recent and remote memory     Normal Attention span and concentration     Normal Language, naming, repeating,spontaneous speech     Fund of knowledge   CRANIAL NERVES: CN II: Visual fields are full to confrontation. Fundoscopic exam is normal with sharp discs and no vascular changes. Pupils are round  equal and briskly reactive to light. CN III, IV, VI: extraocular movement are normal. No ptosis. CN V: Facial sensation is intact to pinprick in all 3 divisions bilaterally. Corneal responses are intact.  CN VII: Face is symmetric with normal eye closure and smile. CN VIII: Hearing is normal to rubbing fingers CN IX, X: Palate elevates symmetrically. Phonation is normal. CN XI: Head turning and shoulder shrug are intact CN XII: Tongue is midline with normal movements and no atrophy.  MOTOR: There is no pronator drift of out-stretched arms. Muscle bulk and tone are normal. Muscle strength is normal.  REFLEXES: Reflexes are 2+ and symmetric at the biceps, triceps, knees, and ankles. Plantar responses are flexor.  SENSORY: Intact to light touch, pinprick, positional sensation and vibratory sensation are intact in fingers and toes.  COORDINATION: Rapid alternating movements and fine finger movements are intact. There is no dysmetria on finger-to-nose and heel-knee-shin.    GAIT/STANCE: Posture is normal. Gait is steady with normal steps, base, arm swing, and turning. Heel and toe walking are normal. Tandem gait is normal.  Romberg is absent.   DIAGNOSTIC DATA (LABS, IMAGING, TESTING) - I reviewed patient records, labs, notes, testing and imaging myself where available.   ASSESSMENT AND PLAN  Danielle Mays is a 47 y.o. female   Constellation of complaints,  Transient paralysis, memory  loss  Essentially normal neurological examination  Proceed with MRI of cervical spine,  Laboratory evaluations,  Will call her report   Levert Feinstein, M.D. Ph.D.  [facility] [address], [address] [place], [place] [address] Ph: [phone] Fax: [phone]  CC: Sharlene Dory, DO

## 2018-09-20 ENCOUNTER — Other Ambulatory Visit: Payer: Self-pay | Admitting: Obstetrics and Gynecology

## 2018-09-20 DIAGNOSIS — Z803 Family history of malignant neoplasm of breast: Secondary | ICD-10-CM

## 2018-10-08 ENCOUNTER — Ambulatory Visit: Payer: Self-pay

## 2018-10-09 ENCOUNTER — Encounter: Payer: Self-pay | Admitting: Family Medicine

## 2018-10-11 ENCOUNTER — Telehealth: Payer: Self-pay | Admitting: *Deleted

## 2018-10-11 NOTE — Telephone Encounter (Signed)
Received Medical records from [facility] with Endo and Colon scopes, forwarded to provider/SLS 12/26

## 2018-12-09 ENCOUNTER — Emergency Department (HOSPITAL_BASED_OUTPATIENT_CLINIC_OR_DEPARTMENT_OTHER)
Admission: EM | Admit: 2018-12-09 | Discharge: 2018-12-09 | Disposition: A | Discharge status: Home / Self Care | Payer: Managed Care, Other (non HMO) | Attending: Emergency Medicine | Admitting: Emergency Medicine

## 2018-12-09 ENCOUNTER — Encounter (HOSPITAL_BASED_OUTPATIENT_CLINIC_OR_DEPARTMENT_OTHER): Payer: Self-pay | Admitting: Emergency Medicine

## 2018-12-09 ENCOUNTER — Other Ambulatory Visit: Payer: Self-pay

## 2018-12-09 DIAGNOSIS — Z79899 Other long term (current) drug therapy: Secondary | ICD-10-CM | POA: Insufficient documentation

## 2018-12-09 DIAGNOSIS — R21 Rash and other nonspecific skin eruption: Secondary | ICD-10-CM | POA: Diagnosis present

## 2018-12-09 DIAGNOSIS — T7840XA Allergy, unspecified, initial encounter: Secondary | ICD-10-CM | POA: Diagnosis not present

## 2018-12-09 LAB — CBC WITH DIFFERENTIAL/PLATELET
Abs Immature Granulocytes: 0.01 10*3/uL (ref 0.00–0.07)
Basophils Absolute: 0 10*3/uL (ref 0.0–0.1)
Basophils Relative: 0 %
EOS PCT: 2 %
Eosinophils Absolute: 0.2 10*3/uL (ref 0.0–0.5)
HCT: 39.9 % (ref 36.0–46.0)
Hemoglobin: 12.8 g/dL (ref 12.0–15.0)
Immature Granulocytes: 0 %
Lymphocytes Relative: 25 %
Lymphs Abs: 2.5 10*3/uL (ref 0.7–4.0)
MCH: 29.2 pg (ref 26.0–34.0)
MCHC: 32.1 g/dL (ref 30.0–36.0)
MCV: 90.9 fL (ref 80.0–100.0)
Monocytes Absolute: 0.6 10*3/uL (ref 0.1–1.0)
Monocytes Relative: 6 %
Neutro Abs: 6.6 10*3/uL (ref 1.7–7.7)
Neutrophils Relative %: 67 %
Platelets: 290 10*3/uL (ref 150–400)
RBC: 4.39 MIL/uL (ref 3.87–5.11)
RDW: 12.4 % (ref 11.5–15.5)
WBC: 9.9 10*3/uL (ref 4.0–10.5)
nRBC: 0 % (ref 0.0–0.2)

## 2018-12-09 LAB — BASIC METABOLIC PANEL
Anion gap: 7 (ref 5–15)
BUN: 14 mg/dL (ref 6–20)
CO2: 23 mmol/L (ref 22–32)
Calcium: 8.9 mg/dL (ref 8.9–10.3)
Chloride: 106 mmol/L (ref 98–111)
Creatinine, Ser: 0.86 mg/dL (ref 0.44–1.00)
GFR calc Af Amer: >60 mL/min (ref >60)
GFR calc non Af Amer: >60 mL/min (ref >60)
Glucose, Bld: 113 mg/dL — ABNORMAL HIGH (ref 70–99)
Potassium: 3.6 mmol/L (ref 3.5–5.1)
Sodium: 136 mmol/L (ref 135–145)

## 2018-12-09 MED ORDER — EPINEPHRINE 0.3 MG/0.3ML IJ SOAJ
0.3000 mg | Freq: Once | INTRAMUSCULAR | Status: AC
  Administered 2018-12-09: 0.3 mg via INTRAMUSCULAR
  Filled 2018-12-09: qty 0.3

## 2018-12-09 MED ORDER — FAMOTIDINE 20 MG PO TABS: 20.0000 mg | ORAL_TABLET | Freq: Two times a day (BID) | ORAL | 0 refills | Status: DC

## 2018-12-09 MED ORDER — EPINEPHRINE 0.3 MG/0.3ML IJ SOAJ: 0.3000 mg | INTRAMUSCULAR | 0 refills | Status: AC | PRN

## 2018-12-09 MED ORDER — DIPHENHYDRAMINE HCL 50 MG/ML IJ SOLN
12.5000 mg | Freq: Once | INTRAMUSCULAR | Status: AC
  Administered 2018-12-09: 12.5 mg via INTRAVENOUS
  Filled 2018-12-09: qty 1

## 2018-12-09 MED ORDER — PREDNISONE 20 MG PO TABS: 40.0000 mg | ORAL_TABLET | Freq: Every day | ORAL | 0 refills | Status: AC

## 2018-12-09 MED ORDER — FAMOTIDINE 20 MG PO TABS: 20.0000 mg | ORAL_TABLET | Freq: Two times a day (BID) | ORAL | 0 refills | Status: AC

## 2018-12-09 MED ORDER — FAMOTIDINE IN NACL 20-0.9 MG/50ML-% IV SOLN
20.0000 mg | Freq: Once | INTRAVENOUS | Status: AC
  Administered 2018-12-09: 20 mg via INTRAVENOUS
  Filled 2018-12-09: qty 50

## 2018-12-09 MED ORDER — SODIUM CHLORIDE 0.9 % IV SOLN
INTRAVENOUS | Status: DC | PRN
  Administered 2018-12-09: 500 mL via INTRAVENOUS

## 2018-12-09 MED ORDER — DIPHENHYDRAMINE HCL 25 MG PO TABS: 25.0000 mg | ORAL_TABLET | Freq: Four times a day (QID) | ORAL | 0 refills | Status: AC

## 2018-12-09 MED ORDER — METHYLPREDNISOLONE SODIUM SUCC 125 MG IJ SOLR
125.0000 mg | Freq: Once | INTRAMUSCULAR | Status: AC
  Administered 2018-12-09: 125 mg via INTRAVENOUS
  Filled 2018-12-09: qty 2

## 2018-12-09 MED ORDER — EPINEPHRINE 0.3 MG/0.3ML IJ SOAJ: 0.3000 mg | INTRAMUSCULAR | 0 refills | Status: DC | PRN

## 2018-12-09 NOTE — Discharge Instructions (Addendum)
Benadryl, Pepcid and prednisone as directed.  I provided you with an EpiPen if this happens again.  As we discussed, you may need allergy testing.  Follow-up with your primary care doctor to arrange for evaluation.  Return the emergency department for any additional rash, difficulty breathing, swelling of your lip or tongue or any other worsening or concerning symptoms.

## 2018-12-09 NOTE — ED Triage Notes (Signed)
Allergic reaction since this morning. Took benadryl at 9 and 1 today. Swelling noted to lip and L ear. Also reports itching.

## 2018-12-09 NOTE — ED Notes (Signed)
ED Provider at bedside. 

## 2018-12-09 NOTE — ED Notes (Signed)
Diet Ginger Ale given for PO challenge.

## 2018-12-09 NOTE — ED Provider Notes (Signed)
[facility] EMERGENCY DEPARTMENT Provider Note   CSN: [id number] Arrival date & time: 12/09/18  1505    History   Chief Complaint Chief Complaint  Patient presents with  . Allergic Reaction    HPI Danielle Mays is a 48 y.o. female past medical history of breast cyst, UTIs who presents for evaluation of diffuse body rash, lip swelling, left ear swelling.  She reports that she woke up this morning about 8:30 AM and noticed a diffuse urticarial rash noted throughout her entire body.  She reports that the area was pruritic but not painful.  She reports taking Benadryl at approximately 9 AM.  She states that the rash continues and she took 1 more dose of Benadryl at 1:00.  Patient reports about an hour prior to ED arrival, she started noticing swelling of her upper lip, left ear and around the posterior aspect of her scalp, the ED visit.  Patient states she has never experienced symptoms like this before.  She states that she has not started any new medications.  She does not note any history of allergies.  She denies any new soaps, lotions, detergents.  She denies any new foods.  Patient states she feels like her throat is slightly tight and dry but states she still able tolerate her secretions.  She has not any difficulty swallowing.  Patient denies any difficulty breathing.  She denies any tongue swelling.  Patient states that she has not had any nausea/vomiting.     The history is provided by the patient.    Past Medical History:  Diagnosis Date  . Bilateral breast cysts   . History of chicken pox    age 89  . History of recurrent UTIs    age 89-25 > 89  . Seizure-like activity [facility])     Patient Active Problem List   Diagnosis Date Noted  . Memory loss 09/19/2018  . Epigastric pain 09/11/2018  . Seizure-like activity (HCC) 09/11/2018  . Neck pain 09/11/2018  . Asymptomatic microscopic hematuria 09/11/2018  . 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  Paresthesia 11/28/2013  . Atypical chest pain 10/22/2013  . Musculoskeletal arm pain 10/22/2013  . Tension headache 10/22/2013  . Weight gain 10/22/2013  . Annual physical exam 07/28/2011    Past Surgical History:  Procedure Laterality Date  . CESAREAN SECTION  07/03/2010  . OVARY SURGERY  1998   Right ovary removed  . TUBAL LIGATION       OB History   No obstetric history on file.      Home Medications    Prior to Admission medications   Medication Sig Start Date End Date Taking? Authorizing Provider  BLISOVI 24 FE 1-20 MG-MCG(24) tablet Take 1 tablet by mouth daily. 06/29/17   [provider]  diphenhydrAMINE (BENADRYL) 25 MG tablet Take 1 tablet (25 mg total) by mouth every 6 (six) hours. 12/09/18   Maxwell Caul, PA-C  EPINEPHrine (EPIPEN 2-PAK) 0.3 mg/0.3 mL IJ SOAJ injection Inject 0.3 mLs (0.3 mg total) into the muscle as needed for anaphylaxis. 12/09/18   Maxwell Caul, PA-C  famotidine (PEPCID) 20 MG tablet Take 1 tablet (20 mg total) by mouth 2 (two) times daily for 4 days. 12/09/18 12/13/18  Maxwell Caul, PA-C  predniSONE (DELTASONE) 20 MG tablet Take 2 tablets (40 mg total) by mouth daily for 4 days. 12/09/18 12/13/18  Maxwell Caul, PA-C    Family History Family History  Problem Relation Age  of Onset  . Drug abuse Father        died cocaine overdose   . Hypertension Mother   . Diabetes type II Mother   . Ovarian cancer Paternal Grandmother   . Emphysema Paternal Grandmother   . Alzheimer's disease Paternal Grandmother   . Breast cancer Maternal Aunt   . Emphysema Paternal Grandfather   . Stroke Maternal Grandfather   . Hypertension Maternal Grandfather   . Hypertension Maternal Grandmother     Social History Social History   Tobacco Use  . Smoking status: Never Smoker  . Smokeless tobacco: Never Used  Substance Use Topics  . Alcohol use: Yes    Comment: social - twice yearly  . Drug use: No     Allergies   Patient has no  known allergies.   Review of Systems Review of Systems  Constitutional: Negative for fever.  HENT: Positive for facial swelling.   Respiratory: Negative for cough and shortness of breath.   Cardiovascular: Negative for chest pain.  Gastrointestinal: Negative for abdominal pain, nausea and vomiting.  Genitourinary: Negative for dysuria and hematuria.  Skin: Positive for rash.  Neurological: Negative for headaches.  All other systems reviewed and are negative.    Physical Exam Updated Vital Signs BP 132/69   Pulse (!) 113   Temp 98.5 F (36.9 C) (Oral)   Resp (!) 25   Ht  (1.575 m)   Wt 90.7 kg   SpO2 96%   BMI 36.58 kg/m   Physical Exam Vitals signs and nursing note reviewed.  Constitutional:      Appearance: Normal appearance. She is well-developed.  HENT:     Head: Normocephalic and atraumatic.     Comments: Airways patent, phonation is intact.    Mouth/Throat:     Mouth: Angioedema present.     Pharynx: Oropharynx is clear.     Comments: Diffuse edema of upper lip.  No tongue edema.  Posterior oropharynx is clear. Eyes:     General: Lids are normal.     Conjunctiva/sclera: Conjunctivae normal.     Pupils: Pupils are equal, round, and reactive to light.  Neck:     Musculoskeletal: Full passive range of motion without pain.  Cardiovascular:     Rate and Rhythm: Normal rate and regular rhythm.     Pulses: Normal pulses.     Heart sounds: Normal heart sounds. No murmur. No friction rub. No gallop.   Pulmonary:     Effort: Pulmonary effort is normal.     Breath sounds: Normal breath sounds.     Comments: Lungs clear to auscultation bilaterally.  Symmetric chest rise.  No wheezing, rales, rhonchi. Able to speak in full sentences without any difficulty. Abdominal:     Palpations: Abdomen is soft. Abdomen is not rigid.     Tenderness: There is no abdominal tenderness. There is no guarding.     Comments: Abdomen is soft, non-distended, non-tender. No rigidity,  No guarding. No peritoneal signs.  Musculoskeletal: Normal range of motion.  Skin:    General: Skin is warm and dry.     Capillary Refill: Capillary refill takes less than 2 seconds.     Findings: Rash present. Rash is urticarial.     Comments: Diffuse urticarial rash noted throughout body.  No rash noted on palms or soles.  Neurological:     Mental Status: She is alert and oriented to person, place, and time.  Psychiatric:        Speech:  Speech normal.      ED Treatments / Results  Labs (all labs ordered are listed, but only abnormal results are displayed) Labs Reviewed  BASIC METABOLIC PANEL - Abnormal; Notable for the following components:      Result Value   Glucose, Bld 113 (*)    All other components within normal limits  CBC WITH DIFFERENTIAL/PLATELET    EKG EKG Interpretation  Date/Time:  "Sunday December 09 2018 15:32:04 EST Ventricular Rate:  97 PR Interval:    QRS Duration: 66 QT Interval:  340 QTC Calculation: 432 R Axis:   3 Text Interpretation:  Normal sinus rhythm Low voltage, precordial leads Abnormal R-wave progression, early transition LVH by voltage No old tracing to compare Confirmed by Goldston, Scott ([id number]) on 12/09/2018 3:34:32 PM   Radiology No results found.  Procedures .Critical Care Performed by: Maybel Dambrosio A, PA-C Authorized by: Tai Skelly A, PA-C   Critical care provider statement:    Critical care time (minutes):  45   Critical care was necessary to treat or prevent imminent or life-threatening deterioration of the following conditions: Angioedema.   Critical care was time spent personally by me on the following activities:  Discussions with consultants, evaluation of patient's response to treatment, examination of patient, ordering and performing treatments and interventions, ordering and review of laboratory studies, ordering and review of radiographic studies, pulse oximetry, re-evaluation of patient's condition, obtaining  history from patient or surrogate and review of old charts   (including critical care time)  Medications Ordered in ED Medications  diphenhydrAMINE (BENADRYL) injection 12.5 mg (12.5 mg Intravenous Given 12/09/18 1538)  famotidine (PEPCID) IVPB 20 mg premix ( Intravenous Stopped 12/09/18 1607)  methylPREDNISolone sodium succinate (SOLU-MEDROL) 125 mg/2 mL injection 125 mg (125 mg Intravenous Given 12/09/18 1537)  EPINEPHrine (EPI-PEN) injection 0.3 mg (0.3 mg Intramuscular Given 12/09/18 1538)     Initial Impression / Assessment and Plan / ED Course  I have reviewed the triage vital signs and the nursing notes.  Pertinent labs & imaging results that were available during my care of the patient were reviewed by me and considered in my medical decision making (see chart for details).        47"  year old female who presents for evaluation of rash and lip swelling.  No known allergies.  No new exposures, lotions, soaps, detergents.  No new medications.  Her reports that lip swelling and ear swelling began about an hour prior to ED arrival.  States she feels like her throat is tight when swallowing but denies any difficulty breathing.  Still able to tolerate her secretions. Patient is afebrile, non-toxic appearing, sitting comfortably on examination table. Vital signs reviewed and stable.  She has diffuse urticaria as well as angioedema of the upper lip.  Plan for EpiPen given system involvement.  We will also give additional Benadryl, Pepcid, steroids and observe.  Reevaluation after 2 hours.  Patient reports improvement in lip swelling and rash.  On my evaluation, lip swelling has decreased significantly.  She states she no longer feels a tightness sensation in her throat.  We will plan to p.o. challenge her and reassess.  Patient able tolerate p.o. without any difficulty.  Reevaluation shows significant improvement in lip swelling.  Rash has improved significantly.  Patient states she feels ready  to go home.  She has no evidence of difficulty breathing.  Vital signs are stable.  She is slightly tachycardic.  I suspect this is mostly due from the epinephrine.  Patient is  now been evaluated for 4 hours status post epinephrine injection.  We will plan to send home with short course of Benadryl, Pepcid, prednisone.  Instructed patient follow-up with her primary care doctor.  Given unknown reactive source, I suggested that patient may need allergy testing. At this time, patient exhibits no emergent life-threatening condition that require further evaluation in ED. Patient had ample opportunity for questions and discussion. All patient's questions were answered with full understanding. Strict return precautions discussed. Patient expresses understanding and agreement to plan.   Portions of this note were generated with Scientist, clinical (histocompatibility and immunogenetics). Dictation errors may occur despite best attempts at proofreading.   Final Clinical Impressions(s) / ED Diagnoses   Final diagnoses:  Allergic reaction, initial encounter    ED Discharge Orders         Ordered    diphenhydrAMINE (BENADRYL) 25 MG tablet  Every 6 hours     12/09/18 1949    famotidine (PEPCID) 20 MG tablet  2 times daily,   Status:  Discontinued     12/09/18 1949    predniSONE (DELTASONE) 20 MG tablet  Daily     12/09/18 1949    EPINEPHrine (EPIPEN 2-PAK) 0.3 mg/0.3 mL IJ SOAJ injection  As needed,   Status:  Discontinued     12/09/18 1949    famotidine (PEPCID) 20 MG tablet  2 times daily     12/09/18 1951    EPINEPHrine (EPIPEN 2-PAK) 0.3 mg/0.3 mL IJ SOAJ injection  As needed     12/09/18 1951           Rosana Hoes 12/10/18 0011    Pricilla Loveless, MD 12/10/18 [phone]

## 2019-03-12 ENCOUNTER — Other Ambulatory Visit: Payer: Self-pay | Admitting: Obstetrics and Gynecology

## 2019-03-12 ENCOUNTER — Ambulatory Visit
Admission: RE | Admit: 2019-03-12 | Discharge: 2019-03-12 | Disposition: A | Discharge status: Home / Self Care | Payer: Managed Care, Other (non HMO) | Source: Ambulatory Visit | Attending: Obstetrics and Gynecology | Admitting: Obstetrics and Gynecology

## 2019-03-12 ENCOUNTER — Other Ambulatory Visit: Payer: Self-pay

## 2019-03-12 DIAGNOSIS — N6001 Solitary cyst of right breast: Secondary | ICD-10-CM

## 2019-03-13 ENCOUNTER — Encounter: Payer: Self-pay | Admitting: Family Medicine

## 2019-06-12 ENCOUNTER — Encounter: Payer: Managed Care, Other (non HMO) | Admitting: Family Medicine

## 2019-09-16 ENCOUNTER — Other Ambulatory Visit: Payer: Managed Care, Other (non HMO)

## 2019-10-01 ENCOUNTER — Other Ambulatory Visit: Payer: Self-pay | Admitting: Obstetrics and Gynecology

## 2019-10-01 ENCOUNTER — Ambulatory Visit
Admission: RE | Admit: 2019-10-01 | Discharge: 2019-10-01 | Disposition: A | Discharge status: Home / Self Care | Payer: Managed Care, Other (non HMO) | Source: Ambulatory Visit | Attending: Obstetrics and Gynecology | Admitting: Obstetrics and Gynecology

## 2019-10-01 ENCOUNTER — Other Ambulatory Visit: Payer: Self-pay

## 2019-10-01 DIAGNOSIS — N6001 Solitary cyst of right breast: Secondary | ICD-10-CM

## 2019-10-01 DIAGNOSIS — N6320 Unspecified lump in the left breast, unspecified quadrant: Secondary | ICD-10-CM

## 2021-07-20 ENCOUNTER — Other Ambulatory Visit: Payer: Self-pay

## 2021-07-20 ENCOUNTER — Emergency Department (HOSPITAL_BASED_OUTPATIENT_CLINIC_OR_DEPARTMENT_OTHER): Payer: Commercial Managed Care - PPO

## 2021-07-20 ENCOUNTER — Emergency Department (HOSPITAL_BASED_OUTPATIENT_CLINIC_OR_DEPARTMENT_OTHER)
Admission: EM | Admit: 2021-07-20 | Discharge: 2021-07-20 | Disposition: A | Discharge status: Home / Self Care | Payer: Commercial Managed Care - PPO | Attending: Emergency Medicine | Admitting: Emergency Medicine

## 2021-07-20 ENCOUNTER — Encounter (HOSPITAL_BASED_OUTPATIENT_CLINIC_OR_DEPARTMENT_OTHER): Payer: Self-pay

## 2021-07-20 DIAGNOSIS — M25511 Pain in right shoulder: Secondary | ICD-10-CM | POA: Insufficient documentation

## 2021-07-20 DIAGNOSIS — M79671 Pain in right foot: Secondary | ICD-10-CM | POA: Insufficient documentation

## 2021-07-20 DIAGNOSIS — M542 Cervicalgia: Secondary | ICD-10-CM | POA: Diagnosis not present

## 2021-07-20 DIAGNOSIS — R079 Chest pain, unspecified: Secondary | ICD-10-CM | POA: Diagnosis present

## 2021-07-20 DIAGNOSIS — R5383 Other fatigue: Secondary | ICD-10-CM | POA: Diagnosis not present

## 2021-07-20 DIAGNOSIS — R0789 Other chest pain: Secondary | ICD-10-CM | POA: Insufficient documentation

## 2021-07-20 DIAGNOSIS — R002 Palpitations: Secondary | ICD-10-CM | POA: Diagnosis not present

## 2021-07-20 LAB — BASIC METABOLIC PANEL
Anion gap: 6 (ref 5–15)
BUN: 11 mg/dL (ref 6–20)
CO2: 24 mmol/L (ref 22–32)
Calcium: 8.7 mg/dL — ABNORMAL LOW (ref 8.9–10.3)
Chloride: 107 mmol/L (ref 98–111)
Creatinine, Ser: 0.67 mg/dL (ref 0.44–1.00)
GFR, Estimated: >60 mL/min (ref >60)
Glucose, Bld: 99 mg/dL (ref 70–99)
Potassium: 4 mmol/L (ref 3.5–5.1)
Sodium: 137 mmol/L (ref 135–145)

## 2021-07-20 LAB — CBC
HCT: 39.3 % (ref 36.0–46.0)
Hemoglobin: 13.3 g/dL (ref 12.0–15.0)
MCH: 30.8 pg (ref 26.0–34.0)
MCHC: 33.8 g/dL (ref 30.0–36.0)
MCV: 91 fL (ref 80.0–100.0)
Platelets: 310 10*3/uL (ref 150–400)
RBC: 4.32 MIL/uL (ref 3.87–5.11)
RDW: 12.3 % (ref 11.5–15.5)
WBC: 7.9 10*3/uL (ref 4.0–10.5)
nRBC: 0 % (ref 0.0–0.2)

## 2021-07-20 LAB — TROPONIN I (HIGH SENSITIVITY): Troponin I (High Sensitivity): <2 ng/L (ref <18)

## 2021-07-20 LAB — PREGNANCY, URINE: Preg Test, Ur: NEGATIVE

## 2021-07-20 MED ORDER — IOHEXOL 350 MG/ML SOLN
100.0000 mL | Freq: Once | INTRAVENOUS | Status: AC | PRN
  Administered 2021-07-20: 100 mL via INTRAVENOUS

## 2021-07-20 NOTE — ED Provider Notes (Signed)
[facility] EMERGENCY DEPARTMENT Provider Note   CSN: [id number] Arrival date & time: 07/20/21  5974     History Chief Complaint  Patient presents with   Chest Pain    Danielle Mays is a 50 y.o. female with no significant past medical history who presents with 2 weeks of heart palpitations, right foot pain, right neck pain, chest pain, chest tightness.  Patient reports that she recently returned from a trip to [facility], had 2 long flights, and while she was in [facility] she felt some heart palpitations, and left-sided chest pain.  Patient also endorses concurrent potentially related right shoulder pain, and right stabbing foot pain.  Patient reports that she has since had few instances of the right shoulder, right foot pain, however they are not associated with movement, or walking, seem to happen sporadically for no reason.  Patient endorses a few instances of heart palpitations without syncope, lightheadedness, nausea, vomiting, diaphoresis and that are sometimes associated with chest pain versus chest tightness.  Patient reports the chest pain chest tightness is not associated with activity, or increased stress and seems to happen at random.  Patient reports she takes low-dose birth control.  She denies cigarette smoking or alcohol.  She denies history of diabetes, coronary artery disease.  Patient also endorses that she has been generally fatigued for the last few weeks which is abnormal for her.   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     Family History  Problem Relation Age of Onset   Drug abuse Father        died cocaine overdose    Hypertension Mother    Diabetes type II Mother    Ovarian cancer Paternal Grandmother    Emphysema Paternal Grandmother    Alzheimer's disease Paternal Grandmother    Breast cancer Maternal Aunt    Emphysema Paternal Grandfather    Stroke Maternal Grandfather    Hypertension Maternal Grandfather    Hypertension Maternal Grandmother     Social History   Tobacco Use   Smoking status: Never   Smokeless tobacco: Never  Substance Use Topics   Alcohol use: Yes    Comment: social - twice yearly   Drug use: No    Home Medications Prior to Admission medications   Medication Sig Start Date End Date Taking? Authorizing Provider  BLISOVI 24 FE 1-20 MG-MCG(24) tablet Take 1 tablet by mouth  daily. 06/29/17   [provider]  diphenhydrAMINE (BENADRYL) 25 MG tablet Take 1 tablet (25 mg total) by mouth every 6 (six) hours. 12/09/18   Maxwell Caul, PA-C  EPINEPHrine (EPIPEN 2-PAK) 0.3 mg/0.3 mL IJ SOAJ injection Inject 0.3 mLs (0.3 mg total) into the muscle as needed for anaphylaxis. 12/09/18   Maxwell Caul, PA-C  famotidine (PEPCID) 20 MG tablet Take 1 tablet (20 mg total) by mouth 2 (two) times daily for 4 days. 12/09/18 12/13/18  Maxwell Caul, PA-C    Allergies    Patient has no known allergies.  Review of Systems   Review of Systems  Constitutional:  Positive for  fatigue. Negative for diaphoresis.  Respiratory:  Positive for chest tightness. Negative for shortness of breath.   Cardiovascular:  Positive for chest pain and palpitations.  Neurological:  Negative for light-headedness.  All other systems reviewed and are negative.  Physical Exam Updated Vital Signs BP 104/65 (BP Location: Right Arm)   Pulse 87   Temp 98.1 F (36.7 C) (Oral)   Resp 18   Ht 5\' 2"  (1.575 m)   Wt 95.3 kg   SpO2 98%   BMI 38.41 kg/m   Physical Exam Vitals and nursing note reviewed.  Constitutional:      General: She is not in acute distress.    Appearance: Normal appearance.  HENT:     Head: Normocephalic and atraumatic.  Eyes:     General:        Right eye: No discharge.        Left eye: No discharge.     Comments: Fixed miotic right pupil compared to left pupil.  No evidence of flushing, facial plethora, redness, or anhidrosis on 1 side compared to the other.  Cardiovascular:     Rate and Rhythm: Normal rate and regular rhythm.     Heart sounds: No murmur heard.   No friction rub. No gallop.  Pulmonary:     Effort: Pulmonary effort is normal.     Breath sounds: Normal breath sounds.  Abdominal:     General: Bowel sounds are normal.     Palpations: Abdomen is soft.     Comments: General vague epigastric, lower quadrant tenderness without rebound, rigidity, guarding.  No right upper quadrant tenderness, negative Murphy sign.  Musculoskeletal:     Comments: No lower extremity edema, patient does endorse some generalized tenderness to her legs, however she does not have focal tenderness or swelling of either calf.  Negative Homans' sign.  Skin:    General: Skin is warm and dry.     Capillary Refill: Capillary refill takes less than 2 seconds.  Neurological:     Mental Status: She is alert and oriented to person, place, and time.  Psychiatric:        Mood and Affect: Mood normal.        Behavior: Behavior normal.    ED Results / Procedures /  Treatments   Labs (all labs ordered are listed, but only abnormal results are displayed) Labs Reviewed  BASIC METABOLIC PANEL - Abnormal; Notable for the following components:      Result Value   Calcium 8.7 (*)    All other components within normal limits  CBC  PREGNANCY, URINE  TROPONIN I (HIGH SENSITIVITY)    EKG EKG Interpretation  Date/Time:  Tuesday July 20 2021 10:16:54 EDT Ventricular Rate:  101 PR Interval:  130 QRS Duration: 72 QT Interval:  336 QTC Calculation: 435  R Axis:   6 Text Interpretation: Sinus tachycardia with occasional Premature ventricular complexes Cannot rule out Anterior infarct , age undetermined Abnormal ECG since last tracing no significant change Confirmed by Rolan Bucco [id number]) on 07/20/2021 3:41:11 PM  Radiology DG Chest 2 View  Result Date: 07/20/2021 CLINICAL DATA:  Chest pain EXAM: CHEST - 2 VIEW COMPARISON:  None. FINDINGS: The heart size and mediastinal contours are within normal limits. No focal airspace consolidation, pleural effusion, or pneumothorax. The visualized skeletal structures are unremarkable. IMPRESSION: No active cardiopulmonary disease. Electronically Signed   By: Duanne Guess D.O.   On: 07/20/2021 10:53   CT Angio Chest PE W and/or Wo Contrast  Result Date: 07/20/2021 CLINICAL DATA:  Pt states 2 weeks ago was traveling to the [facility] and had left arm pain and palpitations. States has been having palpitations since and feels like her heart skips a beat. States also pressure to right back of neck and foot. EXAM: CT ANGIOGRAPHY CHEST WITH CONTRAST TECHNIQUE: Multidetector CT imaging of the chest was performed using the standard protocol during bolus administration of intravenous contrast. Multiplanar CT image reconstructions and MIPs were obtained to evaluate the vascular anatomy. CONTRAST:  OMNIPAQUE IOHEXOL 350 MG/ML SOLN COMPARISON:  Current chest radiograph. FINDINGS: Cardiovascular: Pulmonary arteries are well  opacified. No evidence of a pulmonary embolism. Mediastinum/Nodes: Visualized thyroid gland is unremarkable. No mediastinal or hilar masses. No enlarged lymph nodes. Normal trachea and esophagus. Lungs/Pleura: 4 mm nodule, posterior costophrenic sulcus of the right lower lobe, image 74, series 6. Two tiny triangular shaped nodules adjacent to the oblique fissure, images 46 and 49, series 6. No other nodules. Lungs otherwise clear. No pleural effusion or pneumothorax. Upper Abdomen: Probable fatty infiltration of liver. Otherwise unremarkable. Musculoskeletal: No significant osseous abnormality. Review of the MIP images confirms the above findings. IMPRESSION: 1. No evidence of a pulmonary embolism. 2. No acute findings. 3. 3 small pulmonary nodules, largest 4 mm. No follow-up needed if patient is low-risk (and has no known or suspected primary neoplasm). Non-contrast chest CT can be considered in 12 months if patient is high-risk. This recommendation follows the consensus statement: Guidelines for Management of Incidental Pulmonary Nodules Detected on CT Images: From the Fleischner Society 2017; Radiology 2017; 284:228-243. Electronically Signed   By: Amie Portland M.D.   On: 07/20/2021 15:11    Procedures Procedures   Medications Ordered in ED Medications  iohexol (OMNIPAQUE) 350 MG/ML injection 100 mL (100 mLs Intravenous Contrast Given 07/20/21 1436)    ED Course  I have reviewed the triage vital signs and the nursing notes.  Pertinent labs & imaging results that were available during my care of the patient were reviewed by me and considered in my medical decision making (see chart for details).    MDM Rules/Calculators/A&P                          I discussed this case with my attending physician who cosigned this note including patient's presenting symptoms, physical exam, and planned diagnostics and interventions. Attending physician stated agreement with plan or made changes to plan which  were implemented.   Attending physician assessed patient at bedside.  Given the large differential diagnosis for Danielle Mays, the decision making in this case is of high complexity.  After evaluating all of the data points in this case, the presentation of Danielle Mays is NOT consistent with Acute Coronary Syndrome (ACS) and/or myocardial ischemia,  pulmonary embolism, aortic dissection; Borhaave's, significant arrythmia, pneumothorax, cardiac tamponade, or other emergent cardiopulmonary condition.  Further, the presentation of Danielle Mays is NOT consistent with pericarditis, myocarditis, cholecystitis, pancreatitis, mediastinitis, endocarditis, new valvular disease.  Additionally, the presentation of Danielle Mays is NOT consistent with flail chest, cardiac contusion, ARDS, or significant intra-thoracic or intra-abdominal bleeding.  Moreover, this presentation is NOT consistent with pneumonia, sepsis, or pyelonephritis.  The patient did have a recent trip with long plane ride. She has no clinical evidence of DVT.  She has not had any hemoptysis.  Her CTA was negative for pulmonary embolus.  There is no evidence of superior vena cava syndrome or anything to cause miosis of the right pupil.  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  No evidence of other neurologic deficit, no concern for large vessel occlusion at this time.  Patient does not recall the miosis but has not looked closely in the past.  In the context of feeling of palpitations with an isolated PVC on EKG do recommend further evaluation by cardiology for possible Holter monitor versus stress testing.  Patient's complaints of right shoulder pain, right foot pain are isolated in nature, and not consistent.  Do not think that this is related to her complaints of heart palpitations and chest pain today.  Likely musculoskeletal nature of the right shoulder pain.  Possible nerve impingement  versus Morton's neuroma versus acute resolved musculoskeletal pain.  As the shoulder pain and right foot pain do not continue to bother the patient, do not believe any further investigation at this time as necessary.  Strict return and follow-up precautions have been given by me personally or by detailed written instruction given verbally by nursing staff using the teach back method to the patient/family/caregiver(s).  Data Reviewed/Counseling: I have reviewed the patient's vital signs, nursing notes, and other relevant tests/information. I had a detailed discussion regarding the historical points, exam findings, and any diagnostic results supporting the discharge diagnosis. I also discussed the need for outpatient follow-up and the need to return to the ED if symptoms worsen or if there are any questions or concerns that arise at home.  Final Clinical Impression(s) / ED Diagnoses Final diagnoses:  Atypical chest pain  Acute pain of right shoulder    Rx / DC Orders ED Discharge Orders     None        [place] 07/20/21 1646    Benjiman Core, MD 07/21/21 [phone]

## 2021-07-20 NOTE — ED Triage Notes (Signed)
Pt states 2 weeks ago was traveling to the [facility] and had left arm pain and palpitations. States has been having palpitations since and feels like her heart skips a beat. States also pressure to right back of neck and foot.

## 2021-07-20 NOTE — Discharge Instructions (Signed)
Your work-up today was reassuring, you had no evidence of a dangerous arrhythmia to explain your racing heart.  You did have some early beats which can be a normal finding, however he may noticed the feeling of having early beats which may explain some of your heart palpitations.  I do suggest you follow-up with cardiology for a Holter monitor plus or minus cardiac stress testing.  I have attached the information of a cardiologist in her network for you to follow-up with versus you can take a referral from your primary care doctor if you so choose.  Please return if your symptoms worsen or fail to improve including worsening chest pain, shortness of breath.

## 2021-07-20 NOTE — ED Notes (Signed)
Patient transported to CT 

## 2023-03-07 ENCOUNTER — Other Ambulatory Visit (HOSPITAL_BASED_OUTPATIENT_CLINIC_OR_DEPARTMENT_OTHER): Payer: Self-pay

## 2023-03-07 MED ORDER — WEGOVY 0.25 MG/0.5ML ~~LOC~~ SOAJ
0.2500 mg | SUBCUTANEOUS | 0 refills | Status: DC
  Filled 2023-03-07 – 2023-03-10 (×4): qty 2, 28d supply, fill #0

## 2023-03-09 ENCOUNTER — Other Ambulatory Visit (HOSPITAL_COMMUNITY): Payer: Self-pay

## 2023-03-10 ENCOUNTER — Other Ambulatory Visit (HOSPITAL_BASED_OUTPATIENT_CLINIC_OR_DEPARTMENT_OTHER): Payer: Self-pay

## 2023-03-23 ENCOUNTER — Other Ambulatory Visit (HOSPITAL_BASED_OUTPATIENT_CLINIC_OR_DEPARTMENT_OTHER): Payer: Self-pay

## 2023-03-28 ENCOUNTER — Other Ambulatory Visit (HOSPITAL_BASED_OUTPATIENT_CLINIC_OR_DEPARTMENT_OTHER): Payer: Self-pay

## 2023-03-28 MED ORDER — WEGOVY 0.5 MG/0.5ML ~~LOC~~ SOAJ
0.5000 mg | SUBCUTANEOUS | 0 refills | Status: DC
  Filled 2023-03-28: qty 2, 28d supply, fill #0

## 2023-04-03 ENCOUNTER — Other Ambulatory Visit (HOSPITAL_BASED_OUTPATIENT_CLINIC_OR_DEPARTMENT_OTHER): Payer: Self-pay

## 2023-04-05 ENCOUNTER — Other Ambulatory Visit (HOSPITAL_BASED_OUTPATIENT_CLINIC_OR_DEPARTMENT_OTHER): Payer: Self-pay

## 2023-04-07 ENCOUNTER — Other Ambulatory Visit (HOSPITAL_BASED_OUTPATIENT_CLINIC_OR_DEPARTMENT_OTHER): Payer: Self-pay

## 2023-04-17 ENCOUNTER — Other Ambulatory Visit (HOSPITAL_BASED_OUTPATIENT_CLINIC_OR_DEPARTMENT_OTHER): Payer: Self-pay

## 2023-04-24 IMAGING — CT CT ANGIO CHEST
3 of 9 series · 18 of 36 positions shown · IV contrast (omnipaque)
Comparison: Current chest radiograph.

CLINICAL DATA: [place] pain and palpitations. States has been having
palpitations since and feels like her heart skips a beat. States
also pressure to right back of neck and foot.

EXAM:
CT ANGIOGRAPHY CHEST WITH CONTRAST
TECHNIQUE: Multidetector CT imaging of the chest was performed using the
standard protocol during bolus administration of intravenous
contrast. Multiplanar CT image reconstructions and MIPs were
obtained to evaluate the vascular anatomy.
CONTRAST:  100mL OMNIPAQUE IOHEXOL 350 MG/ML SOLN

[Series 5: pe thins · axial · 0.78mm/px · z∈[-225,+30]mm · 14 of 295 slices shown]
[im 20/295  lung]
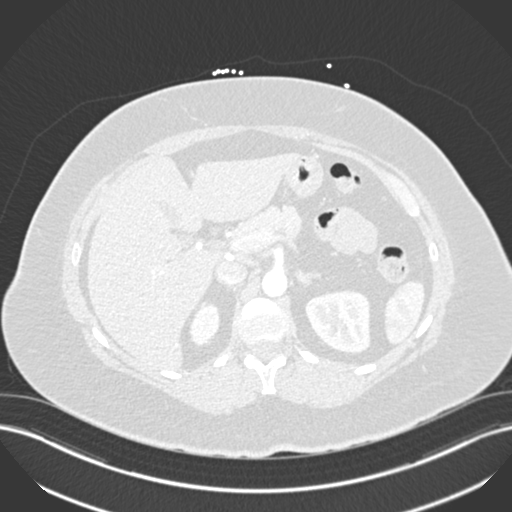
[im 40/295  mediastinal]
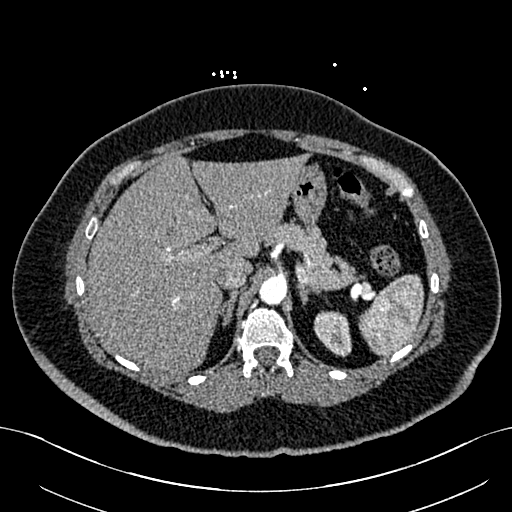
[im 59/295  lung]
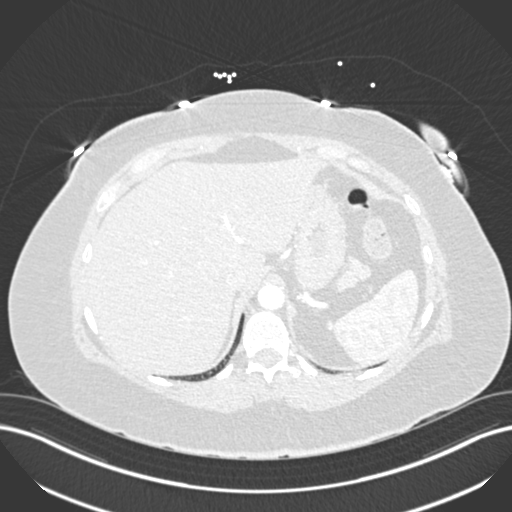
[im 79/295  mediastinal]
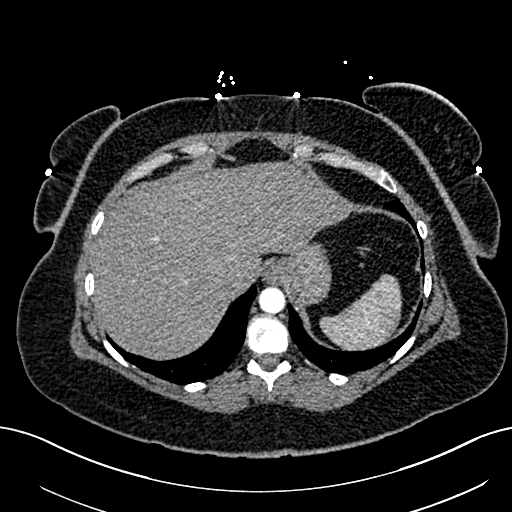
[im 99/295  lung]
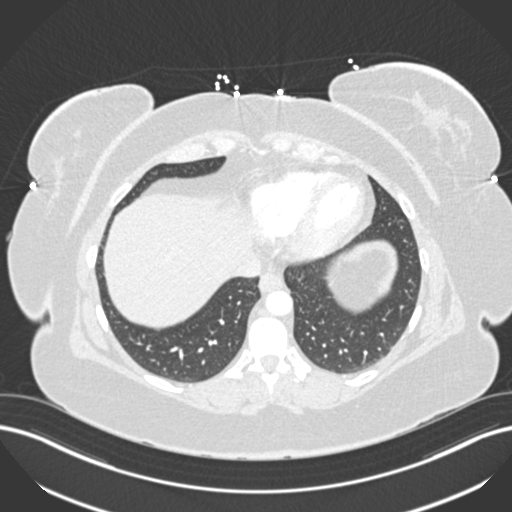
[im 118/295  mediastinal]
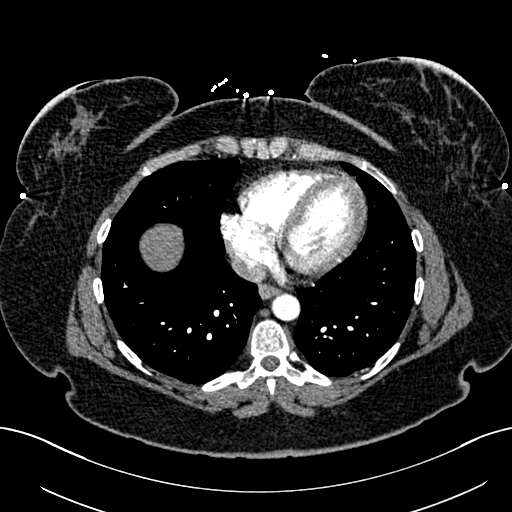
[im 138/295  lung]
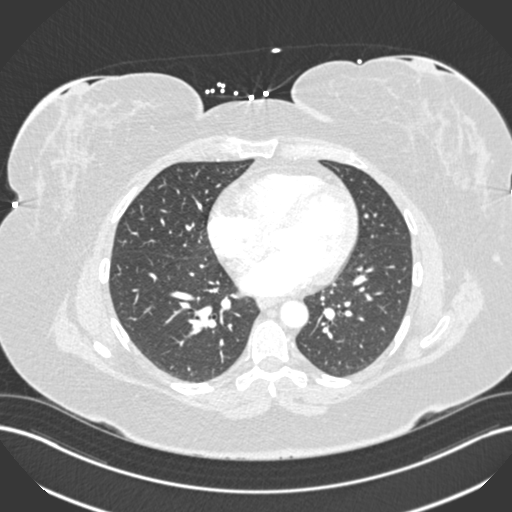
[im 157/295  mediastinal]
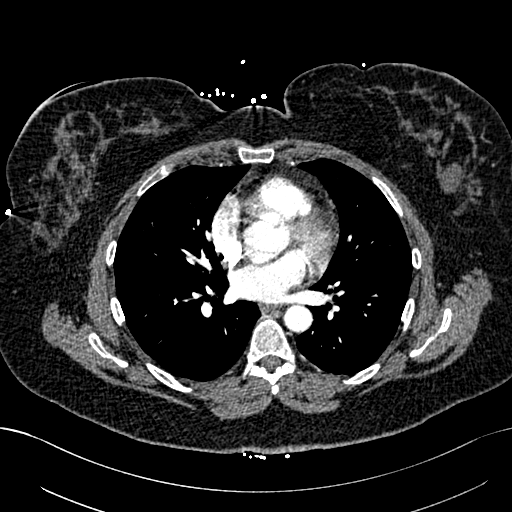
[im 177/295  lung]
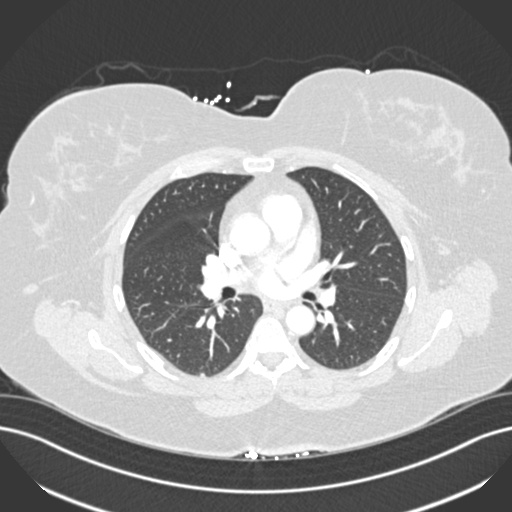
[im 197/295  mediastinal]
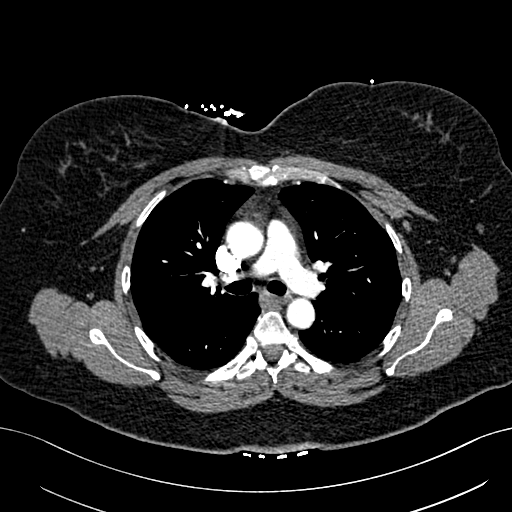
[im 216/295  lung]
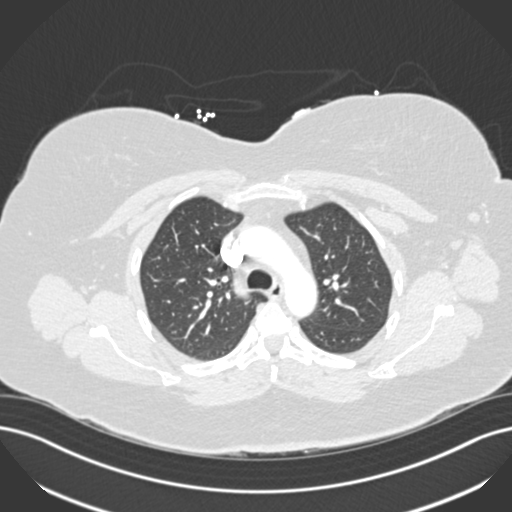
[im 236/295  mediastinal]
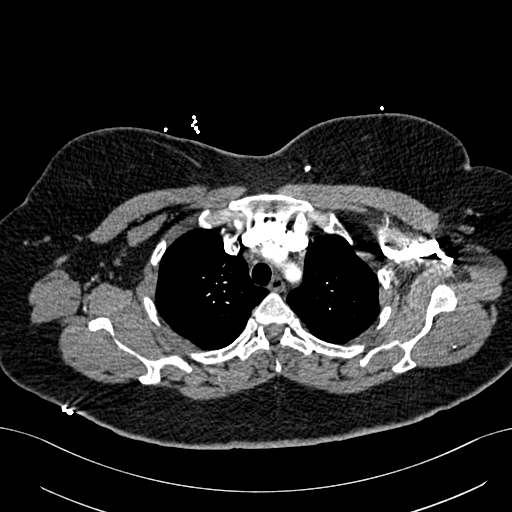
[im 255/295  lung]
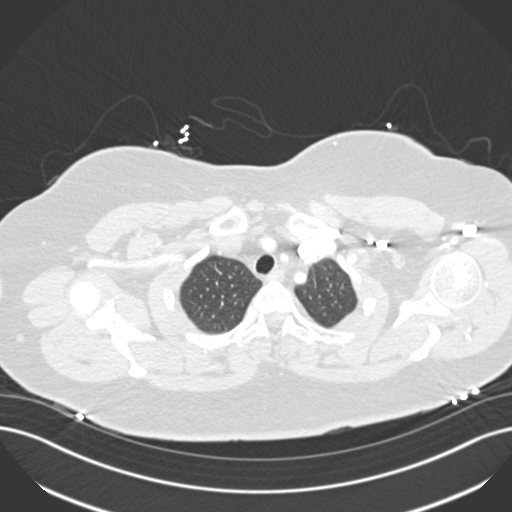
[im 275/295  mediastinal]
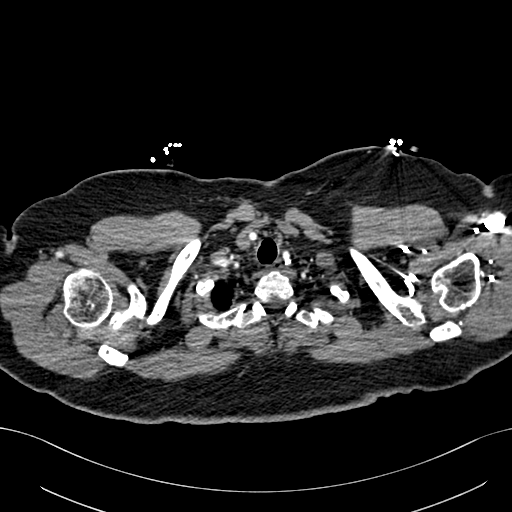

[Series 6: pe lung · axial · 0.79mm/px · z∈[-130,-10]mm · 3 of 80 slices shown]
[im 20/80  mediastinal]
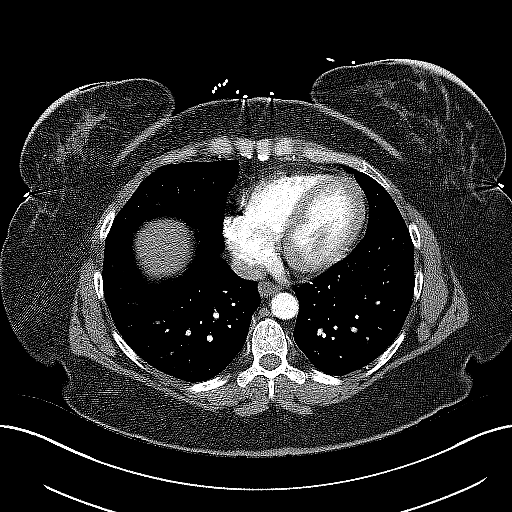
[im 40/80  mediastinal]
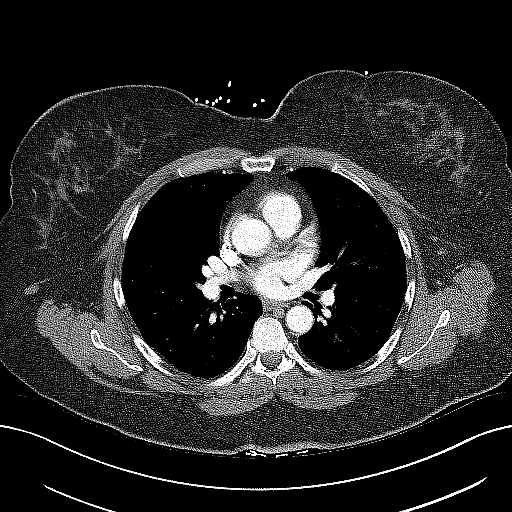
[im 60/80  mediastinal]
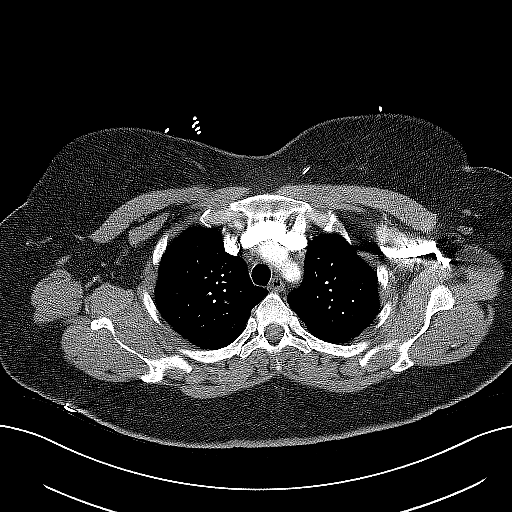

[Series 7: pe coronal mpr · coronal · 0.59mm/px · 1 of 138 slices shown]
[im 69/138  mediastinal]
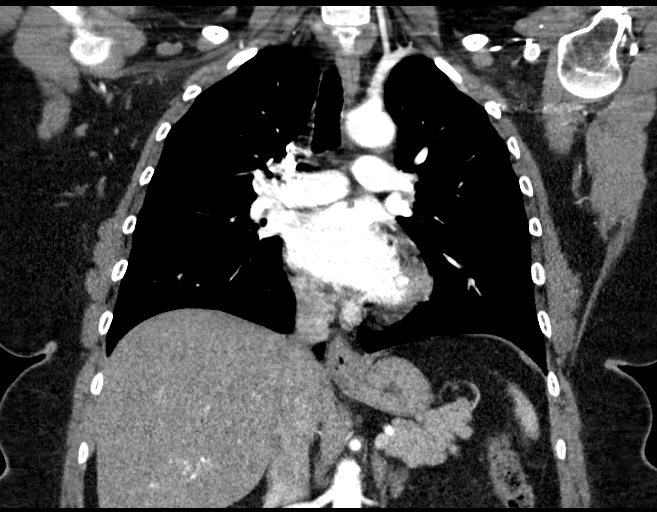

[18 of 36 positions shown; findings below may reference images not displayed]

FINDINGS: Cardiovascular: Pulmonary arteries are well opacified. No evidence
of a pulmonary embolism.

Mediastinum/Nodes: Visualized thyroid gland is unremarkable. No
mediastinal or hilar masses. No enlarged lymph nodes. Normal trachea
and esophagus.

Lungs/Pleura: 4 mm nodule, posterior costophrenic sulcus of the
right lower lobe, image 74, series 6. Two tiny triangular shaped
nodules adjacent to the oblique fissure, images 46 and 49, series 6.
No other nodules. Lungs otherwise clear. No pleural effusion or
pneumothorax.

Upper Abdomen: Probable fatty infiltration of liver. Otherwise
unremarkable.

Musculoskeletal: No significant osseous abnormality.

Review of the MIP images confirms the above findings.
IMPRESSION: 1. No evidence of a pulmonary embolism.
2. No acute findings.
3. 3 small pulmonary nodules, largest 4 mm. No follow-up needed if
patient is low-risk (and has no known or suspected primary
neoplasm). Non-contrast chest CT can be considered in 12 months if
patient is high-risk. This recommendation follows the consensus
statement: Guidelines for Management of Incidental Pulmonary Nodules
Detected on CT Images: From the [HOSPITAL] 1823; Radiology

## 2023-05-03 ENCOUNTER — Other Ambulatory Visit (HOSPITAL_BASED_OUTPATIENT_CLINIC_OR_DEPARTMENT_OTHER): Payer: Self-pay

## 2023-05-03 MED ORDER — WEGOVY 1 MG/0.5ML ~~LOC~~ SOAJ
1.0000 mg | SUBCUTANEOUS | 1 refills | Status: AC
  Filled 2023-05-03: qty 2, 28d supply, fill #0

## 2023-05-04 ENCOUNTER — Other Ambulatory Visit (HOSPITAL_BASED_OUTPATIENT_CLINIC_OR_DEPARTMENT_OTHER): Payer: Self-pay
